# Patient Record
Sex: Female | Born: 1990 | Race: Black or African American | Hispanic: No | Marital: Married | Smoking: Never smoker
Health system: Southern US, Community
[De-identification: ages and names within clinical notes are randomized; demographics above are authoritative.]

## PROBLEM LIST (undated history)

## (undated) ENCOUNTER — Inpatient Hospital Stay (HOSPITAL_COMMUNITY): Payer: Self-pay

## (undated) DIAGNOSIS — Z789 Other specified health status: Secondary | ICD-10-CM

## (undated) HISTORY — PX: NO PAST SURGERIES: SHX2092

---

## 2015-05-24 ENCOUNTER — Emergency Department (HOSPITAL_COMMUNITY)
Admission: EM | Admit: 2015-05-24 | Discharge: 2015-05-24 | Disposition: A | Discharge status: Home / Self Care | Payer: Medicaid Other | Attending: Emergency Medicine | Admitting: Emergency Medicine

## 2015-05-24 ENCOUNTER — Emergency Department (HOSPITAL_COMMUNITY): Payer: Medicaid Other

## 2015-05-24 ENCOUNTER — Encounter (HOSPITAL_COMMUNITY): Payer: Self-pay | Admitting: *Deleted

## 2015-05-24 DIAGNOSIS — O26899 Other specified pregnancy related conditions, unspecified trimester: Secondary | ICD-10-CM

## 2015-05-24 DIAGNOSIS — O9989 Other specified diseases and conditions complicating pregnancy, childbirth and the puerperium: Secondary | ICD-10-CM | POA: Diagnosis present

## 2015-05-24 DIAGNOSIS — Z3A01 Less than 8 weeks gestation of pregnancy: Secondary | ICD-10-CM | POA: Insufficient documentation

## 2015-05-24 DIAGNOSIS — O98811 Other maternal infectious and parasitic diseases complicating pregnancy, first trimester: Secondary | ICD-10-CM | POA: Insufficient documentation

## 2015-05-24 DIAGNOSIS — A5901 Trichomonal vulvovaginitis: Secondary | ICD-10-CM | POA: Insufficient documentation

## 2015-05-24 DIAGNOSIS — R109 Unspecified abdominal pain: Secondary | ICD-10-CM

## 2015-05-24 DIAGNOSIS — Z3490 Encounter for supervision of normal pregnancy, unspecified, unspecified trimester: Secondary | ICD-10-CM

## 2015-05-24 DIAGNOSIS — R1030 Lower abdominal pain, unspecified: Secondary | ICD-10-CM | POA: Diagnosis not present

## 2015-05-24 LAB — BASIC METABOLIC PANEL
Anion gap: 7 (ref 5–15)
BUN: 10 mg/dL (ref 6–20)
CHLORIDE: 108 mmol/L (ref 101–111)
CO2: 23 mmol/L (ref 22–32)
CREATININE: 0.68 mg/dL (ref 0.44–1.00)
Calcium: 9 mg/dL (ref 8.9–10.3)
GFR calc non Af Amer: >60 mL/min (ref >60)
Glucose, Bld: 88 mg/dL (ref 65–99)
Potassium: 3.7 mmol/L (ref 3.5–5.1)
Sodium: 138 mmol/L (ref 135–145)

## 2015-05-24 LAB — ABO/RH: ABO/RH(D): A POS

## 2015-05-24 LAB — CBC WITH DIFFERENTIAL/PLATELET
Basophils Absolute: 0 10*3/uL (ref 0.0–0.1)
Basophils Relative: 0 % (ref 0–1)
EOS ABS: 0.1 10*3/uL (ref 0.0–0.7)
Eosinophils Relative: 2 % (ref 0–5)
HEMATOCRIT: 36.1 % (ref 36.0–46.0)
HEMOGLOBIN: 12.6 g/dL (ref 12.0–15.0)
LYMPHS ABS: 3 10*3/uL (ref 0.7–4.0)
Lymphocytes Relative: 42 % (ref 12–46)
MCH: 32.1 pg (ref 26.0–34.0)
MCHC: 34.9 g/dL (ref 30.0–36.0)
MCV: 92.1 fL (ref 78.0–100.0)
MONOS PCT: 8 % (ref 3–12)
Monocytes Absolute: 0.6 10*3/uL (ref 0.1–1.0)
NEUTROS ABS: 3.5 10*3/uL (ref 1.7–7.7)
NEUTROS PCT: 48 % (ref 43–77)
Platelets: 185 10*3/uL (ref 150–400)
RBC: 3.92 MIL/uL (ref 3.87–5.11)
RDW: 12.4 % (ref 11.5–15.5)
WBC: 7.2 10*3/uL (ref 4.0–10.5)

## 2015-05-24 LAB — URINALYSIS, ROUTINE W REFLEX MICROSCOPIC
Bilirubin Urine: NEGATIVE
Glucose, UA: NEGATIVE mg/dL
HGB URINE DIPSTICK: NEGATIVE
Ketones, ur: NEGATIVE mg/dL
NITRITE: NEGATIVE
PROTEIN: NEGATIVE mg/dL
Specific Gravity, Urine: 1.016 (ref 1.005–1.030)
UROBILINOGEN UA: 1 mg/dL (ref 0.0–1.0)
pH: 7 (ref 5.0–8.0)

## 2015-05-24 LAB — URINE MICROSCOPIC-ADD ON

## 2015-05-24 LAB — WET PREP, GENITAL: Yeast Wet Prep HPF POC: NONE SEEN

## 2015-05-24 LAB — HCG, QUANTITATIVE, PREGNANCY: hCG, Beta Chain, Quant, S: 11122 m[IU]/mL — ABNORMAL HIGH (ref <5)

## 2015-05-24 LAB — POC URINE PREG, ED: PREG TEST UR: POSITIVE — AB

## 2015-05-24 MED ORDER — METRONIDAZOLE 500 MG PO TABS: 500.0000 mg | ORAL_TABLET | Freq: Two times a day (BID) | ORAL | Status: DC

## 2015-05-24 MED ORDER — PRENATAL COMPLETE 14-0.4 MG PO TABS: 1.0000 | ORAL_TABLET | Freq: Every day | ORAL | Status: DC

## 2015-05-24 NOTE — Discharge Instructions (Signed)
Read the information below.  Use the prescribed medication as directed.  Please discuss all new medications with your pharmacist.  You may return to the Emergency Department at any time for worsening condition or any new symptoms that concern you.  If you develop high fevers, worsening abdominal pain, uncontrolled vomiting, or are unable to tolerate fluids by mouth, return to the ER for a recheck.   If you have any concerns or problems involving your pregnancy, please go directly to [facility] MAU  (Maternity Admissions Unit).      Abdominal Pain During Pregnancy Abdominal pain is common in pregnancy. Most of the time, it does not cause harm. There are many causes of abdominal pain. Some causes are more serious than others. Some of the causes of abdominal pain in pregnancy are easily diagnosed. Occasionally, the diagnosis takes time to understand. Other times, the cause is not determined. Abdominal pain can be a sign that something is very wrong with the pregnancy, or the pain may have nothing to do with the pregnancy at all. For this reason, always tell your health care provider if you have any abdominal discomfort. HOME CARE INSTRUCTIONS  Monitor your abdominal pain for any changes. The following actions may help to alleviate any discomfort you are experiencing:  Do not have sexual intercourse or put anything in your vagina until your symptoms go away completely.  Get plenty of rest until your pain improves.  Drink clear fluids if you feel nauseous. Avoid solid food as long as you are uncomfortable or nauseous.  Only take over-the-counter or prescription medicine as directed by your health care provider.  Keep all follow-up appointments with your health care provider. SEEK IMMEDIATE MEDICAL CARE IF:  You are bleeding, leaking fluid, or passing tissue from the vagina.  You have increasing pain or cramping.  You have persistent vomiting.  You have painful or bloody urination.  You  have a fever.  You notice a decrease in your baby's movements.  You have extreme weakness or feel faint.  You have shortness of breath, with or without abdominal pain.  You develop a severe headache with abdominal pain.  You have abnormal vaginal discharge with abdominal pain.  You have persistent diarrhea.  You have abdominal pain that continues even after rest, or gets worse. MAKE SURE YOU:   Understand these instructions.  Will watch your condition.  Will get help right away if you are not doing well or get worse. Document Released: 09/11/2005 Document Revised: 07/02/2013 Document Reviewed: 04/10/2013 [facility] Patient Information 2015 [place], [place]. This information is not intended to replace advice given to you by your health care provider. Make sure you discuss any questions you have with your health care provider.  Trichomoniasis Trichomoniasis is an infection caused by an organism called Trichomonas. The infection can affect both women and men. In women, the outer female genitalia and the vagina are affected. In men, the penis is mainly affected, but the prostate and other reproductive organs can also be involved. Trichomoniasis is a sexually transmitted infection (STI) and is most often passed to another person through sexual contact.  RISK FACTORS  Having unprotected sexual intercourse.  Having sexual intercourse with an infected partner. SIGNS AND SYMPTOMS  Symptoms of trichomoniasis in women include:  Abnormal gray-green frothy vaginal discharge.  Itching and irritation of the vagina.  Itching and irritation of the area outside the vagina. Symptoms of trichomoniasis in men include:   Penile discharge with or without pain.  Pain during  urination. This results from inflammation of the urethra. DIAGNOSIS  Trichomoniasis may be found during a Pap test or physical exam. Your health care provider may use one of the following methods to help diagnose this  infection:  Examining vaginal discharge under a microscope. For men, urethral discharge would be examined.  Testing the pH of the vagina with a test tape.  Using a vaginal swab test that checks for the Trichomonas organism. A test is available that provides results within a few minutes.  Doing a culture test for the organism. This is not usually needed. TREATMENT   You may be given medicine to fight the infection. Women should inform their health care provider if they could be or are pregnant. Some medicines used to treat the infection should not be taken during pregnancy.  Your health care provider may recommend over-the-counter medicines or creams to decrease itching or irritation.  Your sexual partner will need to be treated if infected. HOME CARE INSTRUCTIONS   Take medicines only as directed by your health care provider.  Take over-the-counter medicine for itching or irritation as directed by your health care provider.  Do not have sexual intercourse while you have the infection.  Women should not douche or wear tampons while they have the infection.  Discuss your infection with your partner. Your partner may have gotten the infection from you, or you may have gotten it from your partner.  Have your sex partner get examined and treated if necessary.  Practice safe, informed, and protected sex.  See your health care provider for other STI testing. SEEK MEDICAL CARE IF:   You still have symptoms after you finish your medicine.  You develop abdominal pain.  You have pain when you urinate.  You have bleeding after sexual intercourse.  You develop a rash.  Your medicine makes you sick or makes you throw up (vomit). MAKE SURE YOU:  Understand these instructions.  Will watch your condition.  Will get help right away if you are not doing well or get worse. Document Released: 03/07/2001 Document Revised: 01/26/2014 Document Reviewed: 06/23/2013 [facility] Patient  Information 2015 [place], [place]. This information is not intended to replace advice given to you by your health care provider. Make sure you discuss any questions you have with your health care provider.    First Trimester of Pregnancy The first trimester of pregnancy is from week 1 until the end of week 12 (months 1 through 3). A week after a sperm fertilizes an egg, the egg will implant on the wall of the uterus. This embryo will begin to develop into a baby. Genes from you and your partner are forming the baby. The female genes determine whether the baby is a boy or a girl. At 6-8 weeks, the eyes and face are formed, and the heartbeat can be seen on ultrasound. At the end of 12 weeks, all the baby's organs are formed.  Now that you are pregnant, you will want to do everything you can to have a healthy baby. Two of the most important things are to get good prenatal care and to follow your health care provider's instructions. Prenatal care is all the medical care you receive before the baby's birth. This care will help prevent, find, and treat any problems during the pregnancy and childbirth. BODY CHANGES Your body goes through many changes during pregnancy. The changes vary from woman to woman.   You may gain or lose a couple of pounds at first.  You may  feel sick to your stomach (nauseous) and throw up (vomit). If the vomiting is uncontrollable, call your health care provider.  You may tire easily.  You may develop headaches that can be relieved by medicines approved by your health care provider.  You may urinate more often. Painful urination may mean you have a bladder infection.  You may develop heartburn as a result of your pregnancy.  You may develop constipation because certain hormones are causing the muscles that push waste through your intestines to slow down.  You may develop hemorrhoids or swollen, bulging veins (varicose veins).  Your breasts may begin to grow larger and become  tender. Your nipples may stick out more, and the tissue that surrounds them (areola) may become darker.  Your gums may bleed and may be sensitive to brushing and flossing.  Dark spots or blotches (chloasma, mask of pregnancy) may develop on your face. This will likely fade after the baby is born.  Your menstrual periods will stop.  You may have a loss of appetite.  You may develop cravings for certain kinds of food.  You may have changes in your emotions from day to day, such as being excited to be pregnant or being concerned that something may go wrong with the pregnancy and baby.  You may have more vivid and strange dreams.  You may have changes in your hair. These can include thickening of your hair, rapid growth, and changes in texture. Some women also have hair loss during or after pregnancy, or hair that feels dry or thin. Your hair will most likely return to normal after your baby is born. WHAT TO EXPECT AT YOUR PRENATAL VISITS During a routine prenatal visit:  You will be weighed to make sure you and the baby are growing normally.  Your blood pressure will be taken.  Your abdomen will be measured to track your baby's growth.  The fetal heartbeat will be listened to starting around week 10 or 12 of your pregnancy.  Test results from any previous visits will be discussed. Your health care provider may ask you:  How you are feeling.  If you are feeling the baby move.  If you have had any abnormal symptoms, such as leaking fluid, bleeding, severe headaches, or abdominal cramping.  If you have any questions. Other tests that may be performed during your first trimester include:  Blood tests to find your blood type and to check for the presence of any previous infections. They will also be used to check for low iron levels (anemia) and Rh antibodies. Later in the pregnancy, blood tests for diabetes will be done along with other tests if problems develop.  Urine tests to  check for infections, diabetes, or protein in the urine.  An ultrasound to confirm the proper growth and development of the baby.  An amniocentesis to check for possible genetic problems.  Fetal screens for spina bifida and Down syndrome.  You may need other tests to make sure you and the baby are doing well. HOME CARE INSTRUCTIONS  Medicines  Follow your health care provider's instructions regarding medicine use. Specific medicines may be either safe or unsafe to take during pregnancy.  Take your prenatal vitamins as directed.  If you develop constipation, try taking a stool softener if your health care provider approves. Diet  Eat regular, well-balanced meals. Choose a variety of foods, such as meat or vegetable-based protein, fish, milk and low-fat dairy products, vegetables, fruits, and whole grain breads and cereals.  Your health care provider will help you determine the amount of weight gain that is right for you.  Avoid raw meat and uncooked cheese. These carry germs that can cause birth defects in the baby.  Eating four or five small meals rather than three large meals a day may help relieve nausea and vomiting. If you start to feel nauseous, eating a few soda crackers can be helpful. Drinking liquids between meals instead of during meals also seems to help nausea and vomiting.  If you develop constipation, eat more high-fiber foods, such as fresh vegetables or fruit and whole grains. Drink enough fluids to keep your urine clear or pale yellow. Activity and Exercise  Exercise only as directed by your health care provider. Exercising will help you:  Control your weight.  Stay in shape.  Be prepared for labor and delivery.  Experiencing pain or cramping in the lower abdomen or low back is a good sign that you should stop exercising. Check with your health care provider before continuing normal exercises.  Try to avoid standing for long periods of time. Move your legs often  if you must stand in one place for a long time.  Avoid heavy lifting.  Wear low-heeled shoes, and practice good posture.  You may continue to have sex unless your health care provider directs you otherwise. Relief of Pain or Discomfort  Wear a good support bra for breast tenderness.   Take warm sitz baths to soothe any pain or discomfort caused by hemorrhoids. Use hemorrhoid cream if your health care provider approves.   Rest with your legs elevated if you have leg cramps or low back pain.  If you develop varicose veins in your legs, wear support hose. Elevate your feet for 15 minutes, 3-4 times a day. Limit salt in your diet. Prenatal Care  Schedule your prenatal visits by the twelfth week of pregnancy. They are usually scheduled monthly at first, then more often in the last 2 months before delivery.  Write down your questions. Take them to your prenatal visits.  Keep all your prenatal visits as directed by your health care provider. Safety  Wear your seat belt at all times when driving.  Make a list of emergency phone numbers, including numbers for family, friends, the hospital, and police and fire departments. General Tips  Ask your health care provider for a referral to a local prenatal education class. Begin classes no later than at the beginning of month 6 of your pregnancy.  Ask for help if you have counseling or nutritional needs during pregnancy. Your health care provider can offer advice or refer you to specialists for help with various needs.  Do not use hot tubs, steam rooms, or saunas.  Do not douche or use tampons or scented sanitary pads.  Do not cross your legs for long periods of time.  Avoid cat litter boxes and soil used by cats. These carry germs that can cause birth defects in the baby and possibly loss of the fetus by miscarriage or stillbirth.  Avoid all smoking, herbs, alcohol, and medicines not prescribed by your health care provider. Chemicals in  these affect the formation and growth of the baby.  Schedule a dentist appointment. At home, brush your teeth with a soft toothbrush and be gentle when you floss. SEEK MEDICAL CARE IF:   You have dizziness.  You have mild pelvic cramps, pelvic pressure, or nagging pain in the abdominal area.  You have persistent nausea, vomiting, or diarrhea.  You have a bad smelling vaginal discharge.  You have pain with urination.  You notice increased swelling in your face, hands, legs, or ankles. SEEK IMMEDIATE MEDICAL CARE IF:   You have a fever.  You are leaking fluid from your vagina.  You have spotting or bleeding from your vagina.  You have severe abdominal cramping or pain.  You have rapid weight gain or loss.  You vomit blood or material that looks like coffee grounds.  You are exposed to [place] measles and have never had them.  You are exposed to fifth disease or chickenpox.  You develop a severe headache.  You have shortness of breath.  You have any kind of trauma, such as from a fall or a car accident. Document Released: 09/05/2001 Document Revised: 01/26/2014 Document Reviewed: 07/22/2013 [facility] Patient Information 2015 [place], [place]. This information is not intended to replace advice given to you by your health care provider. Make sure you discuss any questions you have with your health care provider.

## 2015-05-24 NOTE — Care Management (Signed)
ED CM spoke with patient regarding starting prenatal care without insurance. Instructed patient to contact the [facility] Department [address]. [phone]  [phone]. Patient verbalized understanding and states she will follow up tomorrow. No further ED CM needs identified.

## 2015-05-24 NOTE — ED Provider Notes (Signed)
CSN: [id number]     Arrival date & time 05/24/15  1330 History  This chart was scribed for non-physician practitioner [name], PA-C working with Marily Memos, MD by Leone Payor, ED Scribe. This patient was seen in room TR01C/TR01C and the patient's care was started at 4:59 PM.    Chief Complaint  Patient presents with  . Abdominal Pain   The history is provided by the patient. No language interpreter was used.    HPI Comments: Nicole Fuller is a 24 y.o. female who presents to the Emergency Department complaining of 4 days of intermittent, unchanged, bilateral lower abdomen pain which she describes as soreness. She states the most recent episode occurred 2 days ago. She also reports noticing an abdominal knot under the umbilicus, associated increased urination days, and 4 days of constant vaginal discharge which she describes as clear and stringy, profuse.  She denies fever, chills, nausea, vomiting, hematuria, dysuria, urgency, vaginal bleeding, constipation, diarrhea, hematochezia, melena. Her LMP 03/27/15 but states she has irregular periods.    History reviewed. No pertinent past medical history. History reviewed. No pertinent past surgical history. History reviewed. No pertinent family history. Social History  Substance Use Topics  . Smoking status: Never Smoker   . Smokeless tobacco: None  . Alcohol Use: No   OB History    No data available     Review of Systems  Constitutional: Negative for fever and chills.  Gastrointestinal: Positive for abdominal pain. Negative for nausea, vomiting, diarrhea, constipation and blood in stool.  Genitourinary: Positive for frequency and vaginal discharge. Negative for dysuria, urgency, hematuria and vaginal bleeding.  All other systems reviewed and are negative.     Allergies  Review of patient's allergies indicates no known allergies.  Home Medications   Prior to Admission medications   Not on File   BP 130/81 mmHg  Pulse 84   Temp(Src) 98.1 F (36.7 C) (Oral)  Resp 16  SpO2 100% Physical Exam  Constitutional: She appears well-developed and well-nourished. No distress.  HENT:  Head: Normocephalic and atraumatic.  Neck: Neck supple.  Cardiovascular: Normal rate and regular rhythm.   Pulmonary/Chest: Effort normal and breath sounds normal. No respiratory distress. She has no wheezes. She has no rales.  Abdominal: Soft. She exhibits no distension. There is no tenderness. There is no rebound and no guarding.  Genitourinary: Cervix exhibits no motion tenderness. There is tenderness in the vagina. Vaginal discharge found.  Chaperone present: moderate amount of white discharge, vagina diffusely tender. Os is closed. No CMT. Patient unable to fully relax for exam, bimanual therefore limited. No masses noted. No bleeding.   Neurological: She is alert.  Skin: She is not diaphoretic.  Nursing note and vitals reviewed.   ED Course  Procedures (including critical care time)  DIAGNOSTIC STUDIES: Oxygen Saturation is 100% on RA, normal by my interpretation.    COORDINATION OF CARE: 5:13 PM Discussed treatment plan with pt at bedside and pt agreed to plan.   Labs Review Labs Reviewed  URINALYSIS, ROUTINE W REFLEX MICROSCOPIC (NOT AT [facility]) - Abnormal; Notable for the following:    APPearance HAZY (*)    Leukocytes, UA LARGE (*)    All other components within normal limits  URINE MICROSCOPIC-ADD ON - Abnormal; Notable for the following:    Squamous Epithelial / LPF MANY (*)    Bacteria, UA FEW (*)    All other components within normal limits  POC URINE PREG, ED - Abnormal; Notable for the following:  Preg Test, Ur POSITIVE (*)    All other components within normal limits  BASIC METABOLIC PANEL  CBC WITH DIFFERENTIAL/PLATELET  HCG, QUANTITATIVE, PREGNANCY  ABO/RH    Imaging Review No results found. I have personally reviewed and evaluated these images and lab results as part of my medical  decision-making.   EKG Interpretation None      MDM   Final diagnoses:  Abdominal pain in pregnancy  Intrauterine pregnancy  Trichomonas vaginalis (TV) infection   Afebrile, nontoxic patient with lower abdominal pain and increased abnormal vaginal discharge.  Does have increased urination but no dysuria.  Found to be pregnant.  US showed IUP that is early, pt will need to be rechecked to ensure appropriate pregnancy progression.  She is having symptomatic abnormal discharge and UA and wet prep show trich, will treat.  UA appears infected but likely due to trichomonas as pt does have increased urination/frequency that may be related to pregnancy - no dysuria or urgency.  Will send culture.  Would recommend treatment if positive.  Seen by case manager with information for obtaining pregnancy medicaid and follow up.  D/C home with prenatal vitamins, flagyl, information regarding pregnancy, f/u with health department, [facility] hospital for any concerns or worsening symptoms.  Discussed result, findings, treatment, and follow up  with patient.  Pt given return precautions.  Pt verbalizes understanding and agrees with plan.        I personally performed the services described in this documentation, which was scribed in my presence. The recorded information has been reviewed and is accurate.   [name], PA-C 05/24/15 2217  Richardean Canal, MD 05/25/15 [phone]

## 2015-05-24 NOTE — ED Notes (Signed)
Pelvic cart at bedside. 

## 2015-05-24 NOTE — ED Notes (Signed)
Patient reports intermittent lower abdominal pain x 4 days with no associated symptoms, states that she feels like her abdomen is distended.

## 2015-05-25 LAB — GC/CHLAMYDIA PROBE AMP (~~LOC~~) NOT AT ARMC
Chlamydia: NEGATIVE
NEISSERIA GONORRHEA: NEGATIVE

## 2015-05-25 LAB — RPR: RPR Ser Ql: NONREACTIVE

## 2015-05-25 LAB — HIV ANTIBODY (ROUTINE TESTING W REFLEX): HIV SCREEN 4TH GENERATION: NONREACTIVE

## 2015-06-13 ENCOUNTER — Inpatient Hospital Stay (HOSPITAL_COMMUNITY)
Admission: AD | Admit: 2015-06-13 | Discharge: 2015-06-13 | Disposition: A | Discharge status: Home / Self Care | Payer: Medicaid Other | Source: Ambulatory Visit | Attending: Family Medicine | Admitting: Family Medicine

## 2015-06-13 ENCOUNTER — Encounter (HOSPITAL_COMMUNITY): Payer: Self-pay

## 2015-06-13 DIAGNOSIS — O26891 Other specified pregnancy related conditions, first trimester: Secondary | ICD-10-CM | POA: Insufficient documentation

## 2015-06-13 DIAGNOSIS — O26899 Other specified pregnancy related conditions, unspecified trimester: Secondary | ICD-10-CM

## 2015-06-13 DIAGNOSIS — Z3A01 Less than 8 weeks gestation of pregnancy: Secondary | ICD-10-CM | POA: Diagnosis not present

## 2015-06-13 DIAGNOSIS — O9989 Other specified diseases and conditions complicating pregnancy, childbirth and the puerperium: Secondary | ICD-10-CM

## 2015-06-13 DIAGNOSIS — R109 Unspecified abdominal pain: Secondary | ICD-10-CM | POA: Diagnosis present

## 2015-06-13 DIAGNOSIS — Z202 Contact with and (suspected) exposure to infections with a predominantly sexual mode of transmission: Secondary | ICD-10-CM | POA: Insufficient documentation

## 2015-06-13 HISTORY — DX: Other specified health status: Z78.9

## 2015-06-13 LAB — WET PREP, GENITAL
TRICH WET PREP: NONE SEEN
YEAST WET PREP: NONE SEEN

## 2015-06-13 LAB — URINALYSIS, ROUTINE W REFLEX MICROSCOPIC
Bilirubin Urine: NEGATIVE
GLUCOSE, UA: NEGATIVE mg/dL
Hgb urine dipstick: NEGATIVE
KETONES UR: NEGATIVE mg/dL
LEUKOCYTES UA: NEGATIVE
Nitrite: NEGATIVE
PROTEIN: NEGATIVE mg/dL
Specific Gravity, Urine: 1.025 (ref 1.005–1.030)
UROBILINOGEN UA: 0.2 mg/dL (ref 0.0–1.0)
pH: 5.5 (ref 5.0–8.0)

## 2015-06-13 MED ORDER — PROMETHAZINE HCL 25 MG PO TABS: 25.0000 mg | ORAL_TABLET | Freq: Four times a day (QID) | ORAL | Status: DC | PRN

## 2015-06-13 MED ORDER — METRONIDAZOLE 500 MG PO TABS: 500.0000 mg | ORAL_TABLET | Freq: Two times a day (BID) | ORAL | Status: DC

## 2015-06-13 NOTE — MAU Note (Signed)
No vomiting just thirsty all the time, abdominal pain, no vaginal bleeding or discharge, no pain with urination, no constipation. LMP 04/22/15

## 2015-06-13 NOTE — MAU Provider Note (Signed)
History     CSN: [id number]  Arrival date and time: 06/13/15 4098   First Provider Initiated Contact with Patient 06/13/15 1908      Chief Complaint  Patient presents with  . Abdominal Pain  . Polydipsia   HPI 24 y.o. G1P0 at [redacted]w[redacted]d w/ LLQ pain. No vaginal bleeding. Patient also reports nausea w/ no vomiting, denies need for nausea medication, able to keep down food and drink. Pt had u/s at 5.[redacted] weeks EGA showing IUGS and yolk sac. Pt was treated for Trich on 8/29 w/ Flagyl bid x 7 days. She states her partner went for testing, but did not tell the provider that she had trich and he was not treated, they have had unprotected intercourse since she was treated.   Past Medical History  Diagnosis Date  . Medical history non-contributory     Past Surgical History  Procedure Laterality Date  . No past surgeries      History reviewed. No pertinent family history.  Social History  Substance Use Topics  . Smoking status: Never Smoker   . Smokeless tobacco: None  . Alcohol Use: No    Allergies: No Known Allergies  Prescriptions prior to admission  Medication Sig Dispense Refill Last Dose  . Prenatal Vit-Fe Fumarate-FA (PRENATAL COMPLETE) 14-0.4 MG TABS Take 1 tablet by mouth daily. 60 each 5 06/13/2015 at Unknown time  . metroNIDAZOLE (FLAGYL) 500 MG tablet Take 1 tablet (500 mg total) by mouth 2 (two) times daily. (Patient not taking: Reported on 06/13/2015) 14 tablet 0 Completed Course    Review of Systems  Constitutional: Negative.   HENT: Negative.   Eyes: Negative.   Respiratory: Negative.   Cardiovascular: Negative.   Gastrointestinal: Positive for nausea and abdominal pain (LLQ). Negative for vomiting, diarrhea and constipation.  Genitourinary: Negative for dysuria, urgency, frequency, hematuria and flank pain.  Skin: Negative.   Neurological: Negative.    Physical Exam   Blood pressure 128/83, pulse 69, temperature 98.2 F (36.8 C), resp. rate 16, height   (1.753 m), weight 124 lb 9.6 oz (56.518 kg), last menstrual period 04/22/2015.  Physical Exam  Nursing note and vitals reviewed. Constitutional: She is oriented to person, place, and time. She appears well-developed and well-nourished. No distress.  Cardiovascular: Normal rate.   Respiratory: Effort normal.  GI: Soft. She exhibits no distension and no mass. There is tenderness (mild LLQ). There is no rebound and no guarding.  Musculoskeletal: Normal range of motion.  Neurological: She is alert and oriented to person, place, and time.  Skin: Skin is warm and dry.  Psychiatric: She has a normal mood and affect.    MAU Course  Procedures  Results for orders placed or performed during the hospital encounter of 06/13/15 (from the past 24 hour(s))  Urinalysis, Routine w reflex microscopic (not at [facility])     Status: None   Collection Time: 06/13/15  6:50 PM  Result Value Ref Range   Color, Urine YELLOW YELLOW   APPearance CLEAR CLEAR   Specific Gravity, Urine 1.025 1.005 - 1.030   pH 5.5 5.0 - 8.0   Glucose, UA NEGATIVE NEGATIVE mg/dL   Hgb urine dipstick NEGATIVE NEGATIVE   Bilirubin Urine NEGATIVE NEGATIVE   Ketones, ur NEGATIVE NEGATIVE mg/dL   Protein, ur NEGATIVE NEGATIVE mg/dL   Urobilinogen, UA 0.2 0.0 - 1.0 mg/dL   Nitrite NEGATIVE NEGATIVE   Leukocytes, UA NEGATIVE NEGATIVE     Assessment and Plan   1. Abdominal pain in  pregnancy, antepartum   2. Exposure to sexually transmitted disease (STD)   Treating w/ flagyl today for BV and possible reinfection with trichomoniasis, expedited partner therapy provided as well, partner present, rev'd Flagyl instructions, alcohol precautions, no contraindications or allergies, rx for [facility], Flagyl 500 mg, 4 tabs po x 1, disp #4, no refills. Partner treatment info sheet provided.     Medication List    TAKE these medications        metroNIDAZOLE 500 MG tablet  Commonly known as:  FLAGYL  Take 1 tablet (500 mg total) by mouth  2 (two) times daily.     PRENATAL COMPLETE 14-0.4 MG Tabs  Take 1 tablet by mouth daily.     promethazine 25 MG tablet  Commonly known as:  PHENERGAN  Take 1 tablet (25 mg total) by mouth every 6 (six) hours as needed for nausea or vomiting.            Follow-up Information    Follow up with [facility] [place].   Why:  as scheduled   Contact information:   [address] [place] [address] [phone]        FRAZIER,NATALIE 06/13/2015, 7:21 PM

## 2015-06-13 NOTE — Discharge Instructions (Signed)
Expedited Partner Therapy:  °Information Sheet for Patients and Partners  °            ° °You have been offered expedited partner therapy (EPT). This information sheet contains important information and warnings you need to be aware of, so please read it carefully.  ° °Expedited Partner Therapy (EPT) is the clinical practice of treating the sexual partners of persons who receive chlamydia, gonorrhea, or trichomoniasis diagnoses by providing medications or prescriptions to the patient. Patients then provide partners with these therapies without the health-care provider having examined the partner. In other words, EPT is a convenient, fast and private way for patients to help their sexual partners get treated.  ° °Chlamydia and gonorrhea are bacterial infections you get from having sex with a person who is already infected. Trichomoniasis (or “trich”) is a very common sexually transmitted infection (STI) that is caused by infection with a protozoan parasite called Trichomonas vaginalis.  Many people with these infections don’t know it because they feel fine, but without treatment these infections can cause serious health problems, such as pelvic inflammatory disease, ectopic pregnancy, infertility and increased risk of HIV.  ° °It is important to get treated as soon as possible to protect your health, to avoid spreading these infections to others, and to prevent yourself from becoming re-infected. The good news is these infections can be easily cured with proper antibiotic medicine. The best way to take care of your self is to see a doctor or go to your local health department. If you are not able to see a doctor or other medical provider, you should take EPT.  ° ° °Recommended Medication: °EPT for Chlamydia:  Azithromycin (Zithromax) 1 gram orally in a single dose °EPT for Gonorrhea:  Cefixime (Suprax) 400 milligrams orally in a single dose PLUS azithromycin (Zithromax) 1 gram orally in a single dose °EPT for  Trichomoniasis:  Metronidazole (Flagyl) 2 grams orally in a single dose ° ° °These medicines are very safe. However, you should not take them if you have ever had an allergic reaction (like a rash) to any of these medicines: azithromycin (Zithromax), erythromycin, clarithromycin (Biaxin), metronidazole (Flagyl), tinidazole (Tindimax). If you are uncertain about whether you have an allergy, call your medical provider or pharmacist before taking this medicine. If you have a serious, long-term illness like kidney, liver or heart disease, colitis or stomach problems, or you are currently taking other prescription medication, talk to your provider before taking this medication.  ° °Women: If you have lower belly pain, pain during sex, vomiting, or a fever, do not take this medicine. Instead, you should see a medical provider to be certain you do not have pelvic inflammatory disease (PID). PID can be serious and lead to infertility, pregnancy problems or chronic pelvic pain.  ° °Pregnant Women: It is very important for you to see a doctor to get pregnancy services and pre-natal care. These antibiotics for EPT are safe for pregnant women, but you still need to see a medical provider as soon as possible. It is also important to note that Doxycycline is an alternative therapy for chlamydia, but it should not be taken by someone who is pregnant.  ° °Men: If you have pain or swelling in the testicles or a fever, do not take this medicine and see a medical provider.    ° °Men who have sex with men (MSM): MSM in [place] continue to experience high rates of syphilis and HIV. Many MSM with gonorrhea or   chlamydia could also have syphilis and/or HIV and not know it. If you are a man who has sex with other men, it is very important that you see a medical provider and are tested for HIV and syphilis. EPT is not recommended for gonorrhea for MSM.  Recommended treatment for gonorrhea for MSM is Rocephin (shot) AND azithromycin  due to decreased cure rate.  Please see your medical provider if this is the case.   ° °Along with this information sheet is a prescription for the medicine. If you receive a prescription it will be in your name and will indicate your date of birth, or it will be in the name of “Expedited Partner Therapy”.   In either case, you can have the prescription filled at a pharmacy. You will be responsible for the cost of the medicine, unless you have prescription drug coverage. In that case, you could provide your name so the pharmacy could bill your health plan.  ° °Take the medication as directed. Some people will have a mild, upset stomach, which does not last long. AVOID alcohol 24 hours after taking metronidazole (Flagyl) to reduce the possibility of a disulfiram-like reaction (severe vomiting and abdominal pain).  After taking the medicine, do not have sex for 7 days. Do not share this medicine or give it to anyone else. It is important to tell everyone you have had sex with in the last 60 days that they need to go and get tested for sexually transmitted infections.  ° °Ways to prevent these and other sexually transmitted infections (STIs):  ° °• Abstain from sex. This is the only sure way to avoid getting an STI.  °• Use barrier methods, such as condoms, consistently and correctly.  °• Limit the number of sexual partners.  °• Have regular physical exams, including testing for STIs.  ° °For more information about EPT or other issues pertaining to an STI, please contact your medical provider or the [place] [facility] at [phone] or [email or web address]   ° °

## 2015-06-23 ENCOUNTER — Encounter (HOSPITAL_COMMUNITY): Payer: Self-pay | Admitting: *Deleted

## 2015-06-23 ENCOUNTER — Emergency Department (HOSPITAL_COMMUNITY): Payer: Medicaid Other

## 2015-06-23 ENCOUNTER — Emergency Department (HOSPITAL_COMMUNITY)
Admission: EM | Admit: 2015-06-23 | Discharge: 2015-06-23 | Disposition: A | Discharge status: Home / Self Care | Payer: Medicaid Other | Attending: Emergency Medicine | Admitting: Emergency Medicine

## 2015-06-23 DIAGNOSIS — R1084 Generalized abdominal pain: Secondary | ICD-10-CM | POA: Diagnosis not present

## 2015-06-23 DIAGNOSIS — Z3490 Encounter for supervision of normal pregnancy, unspecified, unspecified trimester: Secondary | ICD-10-CM

## 2015-06-23 DIAGNOSIS — R55 Syncope and collapse: Secondary | ICD-10-CM | POA: Insufficient documentation

## 2015-06-23 DIAGNOSIS — R42 Dizziness and giddiness: Secondary | ICD-10-CM | POA: Insufficient documentation

## 2015-06-23 DIAGNOSIS — Z3A09 9 weeks gestation of pregnancy: Secondary | ICD-10-CM | POA: Diagnosis not present

## 2015-06-23 DIAGNOSIS — R102 Pelvic and perineal pain: Secondary | ICD-10-CM

## 2015-06-23 DIAGNOSIS — O9989 Other specified diseases and conditions complicating pregnancy, childbirth and the puerperium: Secondary | ICD-10-CM | POA: Diagnosis present

## 2015-06-23 LAB — CBC
HCT: 37.3 % (ref 36.0–46.0)
Hemoglobin: 13 g/dL (ref 12.0–15.0)
MCH: 31.3 pg (ref 26.0–34.0)
MCHC: 34.9 g/dL (ref 30.0–36.0)
MCV: 89.9 fL (ref 78.0–100.0)
PLATELETS: 184 10*3/uL (ref 150–400)
RBC: 4.15 MIL/uL (ref 3.87–5.11)
RDW: 12.2 % (ref 11.5–15.5)
WBC: 6.1 10*3/uL (ref 4.0–10.5)

## 2015-06-23 LAB — URINALYSIS, ROUTINE W REFLEX MICROSCOPIC
BILIRUBIN URINE: NEGATIVE
GLUCOSE, UA: NEGATIVE mg/dL
HGB URINE DIPSTICK: NEGATIVE
KETONES UR: 15 mg/dL — AB
Nitrite: NEGATIVE
PROTEIN: NEGATIVE mg/dL
Specific Gravity, Urine: 1.029 (ref 1.005–1.030)
Urobilinogen, UA: 1 mg/dL (ref 0.0–1.0)
pH: 6 (ref 5.0–8.0)

## 2015-06-23 LAB — COMPREHENSIVE METABOLIC PANEL
ALBUMIN: 4 g/dL (ref 3.5–5.0)
ALK PHOS: 41 U/L (ref 38–126)
ALT: 67 U/L — AB (ref 14–54)
AST: 51 U/L — ABNORMAL HIGH (ref 15–41)
Anion gap: 8 (ref 5–15)
BILIRUBIN TOTAL: 0.4 mg/dL (ref 0.3–1.2)
BUN: 10 mg/dL (ref 6–20)
CALCIUM: 9.2 mg/dL (ref 8.9–10.3)
CO2: 23 mmol/L (ref 22–32)
CREATININE: 0.65 mg/dL (ref 0.44–1.00)
Chloride: 102 mmol/L (ref 101–111)
GFR calc Af Amer: >60 mL/min (ref >60)
GFR calc non Af Amer: >60 mL/min (ref >60)
GLUCOSE: 83 mg/dL (ref 65–99)
Potassium: 3.8 mmol/L (ref 3.5–5.1)
SODIUM: 133 mmol/L — AB (ref 135–145)
TOTAL PROTEIN: 6.9 g/dL (ref 6.5–8.1)

## 2015-06-23 LAB — LIPASE, BLOOD: Lipase: 19 U/L — ABNORMAL LOW (ref 22–51)

## 2015-06-23 LAB — URINE MICROSCOPIC-ADD ON

## 2015-06-23 LAB — HCG, QUANTITATIVE, PREGNANCY: hCG, Beta Chain, Quant, S: 244247 m[IU]/mL — ABNORMAL HIGH (ref <5)

## 2015-06-23 MED ORDER — SODIUM CHLORIDE 0.9 % IV BOLUS (SEPSIS)
1000.0000 mL | Freq: Once | INTRAVENOUS | Status: AC
  Administered 2015-06-23: 1000 mL via INTRAVENOUS

## 2015-06-23 NOTE — ED Notes (Signed)
Spoke with Dr. Juleen China regarding pt.

## 2015-06-23 NOTE — Discharge Instructions (Signed)
Return without fail for worsening symptoms, including severe abdominal pain, vomiting unable to keep down food or fluids, vaginal bleeding, focal deep breathing or chest pain, or any other symptoms concerning to you.  First Trimester of Pregnancy The first trimester of pregnancy is from week 1 until the end of week 12 (months 1 through 3). During this time, your baby will begin to develop inside you. At 6-8 weeks, the eyes and face are formed, and the heartbeat can be seen on ultrasound. At the end of 12 weeks, all the baby's organs are formed. Prenatal care is all the medical care you receive before the birth of your baby. Make sure you get good prenatal care and follow all of your doctor's instructions. HOME CARE  Medicines  Take medicine only as told by your doctor. Some medicines are safe and some are not during pregnancy.  Take your prenatal vitamins as told by your doctor.  Take medicine that helps you poop (stool softener) as needed if your doctor says it is okay. Diet  Eat regular, healthy meals.  Your doctor will tell you the amount of weight gain that is right for you.  Avoid raw meat and uncooked cheese.  If you feel sick to your stomach (nauseous) or throw up (vomit):  Eat 4 or 5 small meals a day instead of 3 large meals.  Try eating a few soda crackers.  Drink liquids between meals instead of during meals.  If you have a hard time pooping (constipation):  Eat high-fiber foods like fresh vegetables, fruit, and whole grains.  Drink enough fluids to keep your pee (urine) clear or pale yellow. Activity and Exercise  Exercise only as told by your doctor. Stop exercising if you have cramps or pain in your lower belly (abdomen) or low back.  Try to avoid standing for long periods of time. Move your legs often if you must stand in one place for a long time.  Avoid heavy lifting.  Wear low-heeled shoes. Sit and stand up straight.  You can have sex unless your doctor  tells you not to. Relief of Pain or Discomfort  Wear a good support bra if your breasts are sore.  Take warm water baths (sitz baths) to soothe pain or discomfort caused by hemorrhoids. Use hemorrhoid cream if your doctor says it is okay.  Rest with your legs raised if you have leg cramps or low back pain.  Wear support hose if you have puffy, bulging veins (varicose veins) in your legs. Raise (elevate) your feet for 15 minutes, 3-4 times a day. Limit salt in your diet. Prenatal Care  Schedule your prenatal visits by the twelfth week of pregnancy.  Write down your questions. Take them to your prenatal visits.  Keep all your prenatal visits as told by your doctor. Safety  Wear your seat belt at all times when driving.  Make a list of emergency phone numbers. The list should include numbers for family, friends, the hospital, and police and fire departments. General Tips  Ask your doctor for a referral to a local prenatal class. Begin classes no later than at the start of month 6 of your pregnancy.  Ask for help if you need counseling or help with nutrition. Your doctor can give you advice or tell you where to go for help.  Do not use hot tubs, steam rooms, or saunas.  Do not douche or use tampons or scented sanitary pads.  Do not cross your legs for long periods  of time.  Avoid litter boxes and soil used by cats.  Avoid all smoking, herbs, and alcohol. Avoid drugs not approved by your doctor.  Visit your dentist. At home, brush your teeth with a soft toothbrush. Be gentle when you floss. GET HELP IF:  You are dizzy.  You have mild cramps or pressure in your lower belly.  You have a nagging pain in your belly area.  You continue to feel sick to your stomach, throw up, or have watery poop (diarrhea).  You have a bad smelling fluid coming from your vagina.  You have pain with peeing (urination).  You have increased puffiness (swelling) in your face, hands, legs, or  ankles. GET HELP RIGHT AWAY IF:   You have a fever.  You are leaking fluid from your vagina.  You have spotting or bleeding from your vagina.  You have very bad belly cramping or pain.  You gain or lose weight rapidly.  You throw up blood. It may look like coffee grounds.  You are around people who have [place] measles, fifth disease, or chickenpox.  You have a very bad headache.  You have shortness of breath.  You have any kind of trauma, such as from a fall or a car accident. Document Released: 02/28/2008 Document Revised: 01/26/2014 Document Reviewed: 07/22/2013 [facility] Patient Information 2015 [place], [place]. This information is not intended to replace advice given to you by your health care provider. Make sure you discuss any questions you have with your health care provider.

## 2015-06-23 NOTE — ED Notes (Signed)
Pt states that she began having dizziness today at 3pm. States that she feels like the room is spinning. Pt reports being [redacted] weeks pregnant. No other neuro deficits noted in triage.

## 2015-06-23 NOTE — ED Provider Notes (Signed)
CSN: [id number]     Arrival date & time 06/23/15  1616 History   First MD Initiated Contact with Patient 06/23/15 1937     Chief Complaint  Patient presents with  . Dizziness  . Abdominal Pain     (Consider location/radiation/quality/duration/timing/severity/associated sxs/prior Treatment) HPI 24 year old female G1 P0 at [redacted] weeks gestational age who presents with dizziness and abdominal pain. Reports being in her usual state of health. Reports that she ate too much last night and developed generalized abdominal pain. That is since results, but today was out side for an extended period time without eating or drinking much. Reports that she suddenly began to feel very lightheaded and dizzy. He did not have syncope, chest pain, difficulty breathing. I didn't drink water but did not feel her symptoms improved so came to the ED for evaluation. Denies nausea vomiting diarrhea vaginal bleeding or vaginal discharge. States that she has minimal abdominal pain currently. Past Medical History  Diagnosis Date  . Medical history non-contributory    Past Surgical History  Procedure Laterality Date  . No past surgeries     History reviewed. No pertinent family history. Social History  Substance Use Topics  . Smoking status: Never Smoker   . Smokeless tobacco: None  . Alcohol Use: No   OB History    Gravida Para Term Preterm AB TAB SAB Ectopic Multiple Living   1 0        0     Review of Systems 10/14 systems reviewed and are negative other than those stated in the HPI    Allergies  Promethazine  Home Medications   Prior to Admission medications   Medication Sig Start Date End Date Taking? Authorizing Provider  Prenatal Vit-Fe Fumarate-FA (PRENATAL COMPLETE) 14-0.4 MG TABS Take 1 tablet by mouth daily. 05/24/15  Yes [name], PA-C  promethazine (PHENERGAN) 25 MG tablet Take 1 tablet (25 mg total) by mouth every 6 (six) hours as needed for nausea or vomiting. 06/13/15  Yes Archie Patten, CNM  metroNIDAZOLE (FLAGYL) 500 MG tablet Take 1 tablet (500 mg total) by mouth 2 (two) times daily. Patient not taking: Reported on 06/23/2015 06/13/15   Archie Patten, CNM   BP 108/57 mmHg  Pulse 78  Temp(Src) 98.8 F (37.1 C) (Oral)  Resp 21  Ht  (1.753 m)  Wt 124 lb (56.246 kg)  BMI 18.30 kg/m2  SpO2 100%  LMP 04/22/2015 (Approximate) Physical Exam Physical Exam  Nursing note and vitals reviewed. Constitutional: Well developed, well nourished, non-toxic, and in no acute distress Head: Normocephalic and atraumatic.  Mouth/Throat: Oropharynx is clear and moist.  Neck: Normal range of motion. Neck supple.  Cardiovascular: Normal rate and regular rhythm.  No edema Pulmonary/Chest: Effort normal and breath sounds normal.  Abdominal: Soft. There is no tenderness. There is no rebound and no guarding.  Musculoskeletal: Normal range of motion.  Neurological: Alert, no facial droop, fluent speech, moves all extremities symmetrically Skin: Skin is warm and dry.  Psychiatric: Cooperative  ED Course  Procedures (including critical care time) Labs Review Labs Reviewed  LIPASE, BLOOD - Abnormal; Notable for the following:    Lipase 19 (*)    All other components within normal limits  COMPREHENSIVE METABOLIC PANEL - Abnormal; Notable for the following:    Sodium 133 (*)    AST 51 (*)    ALT 67 (*)    All other components within normal limits  URINALYSIS, ROUTINE W REFLEX MICROSCOPIC (NOT AT  [facility]) - Abnormal; Notable for the following:    Ketones, ur 15 (*)    Leukocytes, UA SMALL (*)    All other components within normal limits  URINE MICROSCOPIC-ADD ON - Abnormal; Notable for the following:    Squamous Epithelial / LPF FEW (*)    Bacteria, UA FEW (*)    All other components within normal limits  HCG, QUANTITATIVE, PREGNANCY - Abnormal; Notable for the following:    hCG, Beta Chain, Sharene Butters, [place] [id number] (*)    All other components within normal limits  URINE CULTURE   CBC    Imaging Review US Ob Comp Less 14 Wks  06/23/2015   CLINICAL DATA:  Pregnant patient with abdominal pain and dizziness.  EXAM: OBSTETRIC <14 WK [place] AND TRANSVAGINAL OB US  TECHNIQUE: Both transabdominal and transvaginal ultrasound examinations were performed for complete evaluation of the gestation as well as the maternal uterus, adnexal regions, and pelvic cul-de-sac. Transvaginal technique was performed to assess early pregnancy.  COMPARISON:  05/24/2015  FINDINGS: Intrauterine gestational sac: Visualized/normal in shape.  Yolk sac:  Present.  Embryo:  Present.  Cardiac Activity: Present.  Heart Rate: 182  bpm  CRL:  27.4  mm   9 w   4 d                  [place] EDC: 01/22/2016  Maternal uterus/adnexae: No subchorionic hemorrhage. There is a complex cyst in the left ovary measuring 3.3 x 2.1 x 2.6 cm with septation versus 2 adjacent cysts. Blood flow is noted to the ovarian parenchyma. The right ovary is normal. Small amount of free fluid in the pelvis.  IMPRESSION: 1. Single live intrauterine pregnancy estimated gestational age [redacted] weeks 4 days for estimated date of delivery 01/22/2016. There is mild fetal tachycardia with fetal heart rate of 182 beats per minute. Close obstetric follow-up recommended. 2. Septated cyst in the left ovary versus 2 adjacent cysts, measuring 3.3 cm.   Electronically Signed   By: Rubye Oaks M.D.   On: 06/23/2015 22:06   US Ob Transvaginal  06/23/2015   CLINICAL DATA:  Pregnant patient with abdominal pain and dizziness.  EXAM: OBSTETRIC <14 WK [place] AND TRANSVAGINAL OB US  TECHNIQUE: Both transabdominal and transvaginal ultrasound examinations were performed for complete evaluation of the gestation as well as the maternal uterus, adnexal regions, and pelvic cul-de-sac. Transvaginal technique was performed to assess early pregnancy.  COMPARISON:  05/24/2015  FINDINGS: Intrauterine gestational sac: Visualized/normal in shape.  Yolk sac:  Present.  Embryo:  Present.  Cardiac  Activity: Present.  Heart Rate: 182  bpm  CRL:  27.4  mm   9 w   4 d                  [place] EDC: 01/22/2016  Maternal uterus/adnexae: No subchorionic hemorrhage. There is a complex cyst in the left ovary measuring 3.3 x 2.1 x 2.6 cm with septation versus 2 adjacent cysts. Blood flow is noted to the ovarian parenchyma. The right ovary is normal. Small amount of free fluid in the pelvis.  IMPRESSION: 1. Single live intrauterine pregnancy estimated gestational age [redacted] weeks 4 days for estimated date of delivery 01/22/2016. There is mild fetal tachycardia with fetal heart rate of 182 beats per minute. Close obstetric follow-up recommended. 2. Septated cyst in the left ovary versus 2 adjacent cysts, measuring 3.3 cm.   Electronically Signed   By: Rubye Oaks M.D.   On: 06/23/2015 22:06  I have personally reviewed and evaluated these images and lab results as part of my medical decision-making.   EKG Interpretation   Date/Time:  Wednesday June 23 2015 16:51:45 EDT Ventricular Rate:  76 PR Interval:  130 QRS Duration: 76 QT Interval:  360 QTC Calculation: 405 R Axis:   20 Text Interpretation:  Sinus rhythm with marked sinus arrhythmia Otherwise  normal ECG no prior for comparison Confirmed by LIU MD, DANA [phone]) on  06/23/2015 7:57:14 PM      MDM   Final diagnoses:  Near syncope  Intrauterine pregnancy    24 year old female G1 P0 at [redacted] weeks gestational age who presents with near syncope and abdominal pain. She is asymptomatic on arrival, and is well-appearing. Vital signs are within normal limits. She has a soft and nontender abdomen. Low suspicion for serious intra-abdominal processes, but given her abdominal pain and pregnancy I did want to rule out ectopic. Ultrasound revealing IUP at 9 weeks 4 days. No vaginal bleeding. Blood work including CBC, comprehensive metabolic profile, lipase, is unremarkable. UA does show some bacteria, but significant amount of squamous epithelial cells.  We'll send for urine culture and treat if bacteria cultured. Near syncopal episode seems orthostatic in nature as she was out in the heat and had not eaten or drinking much today. EKG without stigmata of arrhythmia. No symptoms with exertional activities. She is given IV fluids and feels symptomatically improved. She has follow-up in 2 days with OB. Strict return and follow-up instructions are reviewed. She expressed understanding of all discharge instructions for comfortable to plan of care.    Lavera Guise, MD 06/24/15 [phone]

## 2015-06-25 LAB — URINE CULTURE

## 2015-07-07 ENCOUNTER — Emergency Department (HOSPITAL_COMMUNITY)
Admission: EM | Admit: 2015-07-07 | Discharge: 2015-07-07 | Disposition: A | Discharge status: Home / Self Care | Payer: Medicaid Other | Attending: Emergency Medicine | Admitting: Emergency Medicine

## 2015-07-07 ENCOUNTER — Encounter (HOSPITAL_COMMUNITY): Payer: Self-pay | Admitting: Emergency Medicine

## 2015-07-07 ENCOUNTER — Emergency Department (HOSPITAL_COMMUNITY): Payer: Medicaid Other

## 2015-07-07 DIAGNOSIS — N760 Acute vaginitis: Secondary | ICD-10-CM | POA: Insufficient documentation

## 2015-07-07 DIAGNOSIS — O209 Hemorrhage in early pregnancy, unspecified: Secondary | ICD-10-CM | POA: Diagnosis not present

## 2015-07-07 DIAGNOSIS — B9689 Other specified bacterial agents as the cause of diseases classified elsewhere: Secondary | ICD-10-CM

## 2015-07-07 DIAGNOSIS — B373 Candidiasis of vulva and vagina: Secondary | ICD-10-CM | POA: Insufficient documentation

## 2015-07-07 DIAGNOSIS — O98811 Other maternal infectious and parasitic diseases complicating pregnancy, first trimester: Secondary | ICD-10-CM | POA: Insufficient documentation

## 2015-07-07 DIAGNOSIS — O9989 Other specified diseases and conditions complicating pregnancy, childbirth and the puerperium: Secondary | ICD-10-CM | POA: Insufficient documentation

## 2015-07-07 DIAGNOSIS — R102 Pelvic and perineal pain unspecified side: Secondary | ICD-10-CM

## 2015-07-07 DIAGNOSIS — B3731 Acute candidiasis of vulva and vagina: Secondary | ICD-10-CM

## 2015-07-07 DIAGNOSIS — O4690 Antepartum hemorrhage, unspecified, unspecified trimester: Secondary | ICD-10-CM

## 2015-07-07 LAB — WET PREP, GENITAL: TRICH WET PREP: NONE SEEN

## 2015-07-07 LAB — URINALYSIS, ROUTINE W REFLEX MICROSCOPIC
Bilirubin Urine: NEGATIVE
Glucose, UA: NEGATIVE mg/dL
HGB URINE DIPSTICK: NEGATIVE
Ketones, ur: NEGATIVE mg/dL
Nitrite: NEGATIVE
PROTEIN: NEGATIVE mg/dL
SPECIFIC GRAVITY, URINE: 1.009 (ref 1.005–1.030)
UROBILINOGEN UA: 1 mg/dL (ref 0.0–1.0)
pH: 7 (ref 5.0–8.0)

## 2015-07-07 LAB — URINE MICROSCOPIC-ADD ON

## 2015-07-07 LAB — ABO/RH: ABO/RH(D): A POS

## 2015-07-07 LAB — GC/CHLAMYDIA PROBE AMP (~~LOC~~) NOT AT ARMC
CHLAMYDIA, DNA PROBE: NEGATIVE
Neisseria Gonorrhea: NEGATIVE

## 2015-07-07 LAB — HCG, QUANTITATIVE, PREGNANCY: hCG, Beta Chain, Quant, S: 158997 m[IU]/mL — ABNORMAL HIGH (ref <5)

## 2015-07-07 MED ORDER — FLUCONAZOLE 100 MG PO TABS
150.0000 mg | ORAL_TABLET | Freq: Once | ORAL | Status: AC
  Administered 2015-07-07: 150 mg via ORAL
  Filled 2015-07-07: qty 2

## 2015-07-07 MED ORDER — METRONIDAZOLE 500 MG PO TABS
500.0000 mg | ORAL_TABLET | Freq: Once | ORAL | Status: AC
  Administered 2015-07-07: 500 mg via ORAL
  Filled 2015-07-07: qty 1

## 2015-07-07 MED ORDER — METRONIDAZOLE 500 MG PO TABS: 500.0000 mg | ORAL_TABLET | Freq: Two times a day (BID) | ORAL | Status: DC

## 2015-07-07 NOTE — ED Provider Notes (Signed)
Received patient in care from Dr. Elesa MassedWard.  Patient with vaginal bleeding and abdominal pain, awaiting US.  US with unremarkable 12 week singleton pregnancy.  D/c home.  Patient with BV on wet prep treat with flagyl.     Nicole Planan Sadye Kiernan, DO 07/07/15 1024

## 2015-07-07 NOTE — Discharge Instructions (Signed)
Vaginal Bleeding During Pregnancy, First Trimester A small amount of bleeding (spotting) from the vagina is relatively common in early pregnancy. It usually stops on its own. Various things may cause bleeding or spotting in early pregnancy. Some bleeding may be related to the pregnancy, and some may not. In most cases, the bleeding is normal and is not a problem. However, bleeding can also be a sign of something serious. Be sure to tell your health care provider about any vaginal bleeding right away. Some possible causes of vaginal bleeding during the first trimester include:  Infection or inflammation of the cervix.  Growths (polyps) on the cervix.  Miscarriage or threatened miscarriage.  Pregnancy tissue has developed outside of the uterus and in a fallopian tube (tubal pregnancy).  Tiny cysts have developed in the uterus instead of pregnancy tissue (molar pregnancy). HOME CARE INSTRUCTIONS  Watch your condition for any changes. The following actions may help to lessen any discomfort you are feeling:  Follow your health care provider's instructions for limiting your activity. If your health care provider orders bed rest, you may need to stay in bed and only get up to use the bathroom. However, your health care provider may allow you to continue light activity.  If needed, make plans for someone to help with your regular activities and responsibilities while you are on bed rest.  Keep track of the number of pads you use each day, how often you change pads, and how soaked (saturated) they are. Write this down.  Do not use tampons. Do not douche.  Do not have sexual intercourse or orgasms until approved by your health care provider.  If you pass any tissue from your vagina, save the tissue so you can show it to your health care provider.  Only take over-the-counter or prescription medicines as directed by your health care provider.  Do not take aspirin because it can make you  bleed.  Keep all follow-up appointments as directed by your health care provider. SEEK MEDICAL CARE IF:  You have any vaginal bleeding during any part of your pregnancy.  You have cramps or labor pains.  You have a fever, not controlled by medicine. SEEK IMMEDIATE MEDICAL CARE IF:   You have severe cramps in your back or belly (abdomen).  You pass large clots or tissue from your vagina.  Your bleeding increases.  You feel light-headed or weak, or you have fainting episodes.  You have chills.  You are leaking fluid or have a gush of fluid from your vagina.  You pass out while having a bowel movement. MAKE SURE YOU:  Understand these instructions.  Will watch your condition.  Will get help right away if you are not doing well or get worse.   This information is not intended to replace advice given to you by your health care provider. Make sure you discuss any questions you have with your health care provider.   Document Released: 06/21/2005 Document Revised: 09/16/2013 Document Reviewed: 05/19/2013 Elsevier Interactive Patient Education 2016 Elsevier Inc.   Bacterial Vaginosis Bacterial vaginosis is a vaginal infection that occurs when the normal balance of bacteria in the vagina is disrupted. It results from an overgrowth of certain bacteria. This is the most common vaginal infection in women of childbearing age. Treatment is important to prevent complications, especially in pregnant women, as it can cause a premature delivery. CAUSES  Bacterial vaginosis is caused by an increase in harmful bacteria that are normally present in smaller amounts in the  vagina. Several different kinds of bacteria can cause bacterial vaginosis. However, the reason that the condition develops is not fully understood. RISK FACTORS Certain activities or behaviors can put you at an increased risk of developing bacterial vaginosis, including:  Having a new sex partner or multiple sex  partners.  Douching.  Using an intrauterine device (IUD) for contraception. Women do not get bacterial vaginosis from toilet seats, bedding, swimming pools, or contact with objects around them. SIGNS AND SYMPTOMS  Some women with bacterial vaginosis have no signs or symptoms. Common symptoms include:  Grey vaginal discharge.  A fishlike odor with discharge, especially after sexual intercourse.  Itching or burning of the vagina and vulva.  Burning or pain with urination. DIAGNOSIS  Your health care provider will take a medical history and examine the vagina for signs of bacterial vaginosis. A sample of vaginal fluid may be taken. Your health care provider will look at this sample under a microscope to check for bacteria and abnormal cells. A vaginal pH test may also be done.  TREATMENT  Bacterial vaginosis may be treated with antibiotic medicines. These may be given in the form of a pill or a vaginal cream. A second round of antibiotics may be prescribed if the condition comes back after treatment. Because bacterial vaginosis increases your risk for sexually transmitted diseases, getting treated can help reduce your risk for chlamydia, gonorrhea, HIV, and herpes. HOME CARE INSTRUCTIONS   Only take over-the-counter or prescription medicines as directed by your health care provider.  If antibiotic medicine was prescribed, take it as directed. Make sure you finish it even if you start to feel better.  Tell all sexual partners that you have a vaginal infection. They should see their health care provider and be treated if they have problems, such as a mild rash or itching.  During treatment, it is important that you follow these instructions:  Avoid sexual activity or use condoms correctly.  Do not douche.  Avoid alcohol as directed by your health care provider.  Avoid breastfeeding as directed by your health care provider. SEEK MEDICAL CARE IF:   Your symptoms are not improving  after 3 days of treatment.  You have increased discharge or pain.  You have a fever. MAKE SURE YOU:   Understand these instructions.  Will watch your condition.  Will get help right away if you are not doing well or get worse. FOR MORE INFORMATION  Centers for Disease Control and Prevention, Division of STD Prevention: SolutionApps.co.za American Sexual Health Association (ASHA): www.ashastd.org    This information is not intended to replace advice given to you by your health care provider. Make sure you discuss any questions you have with your health care provider.   Document Released: 09/11/2005 Document Revised: 10/02/2014 Document Reviewed: 04/23/2013 Elsevier Interactive Patient Education 2016 Elsevier Inc.   Monilial Vaginitis Vaginitis in a soreness, swelling and redness (inflammation) of the vagina and vulva. Monilial vaginitis is not a sexually transmitted infection. CAUSES  Yeast vaginitis is caused by yeast (candida) that is normally found in your vagina. With a yeast infection, the candida has overgrown in number to a point that upsets the chemical balance. SYMPTOMS   White, thick vaginal discharge.  Swelling, itching, redness and irritation of the vagina and possibly the lips of the vagina (vulva).  Burning or painful urination.  Painful intercourse. DIAGNOSIS  Things that may contribute to monilial vaginitis are:  Postmenopausal and virginal states.  Pregnancy.  Infections.  Being tired, sick or  stressed, especially if you had monilial vaginitis in the past.  Diabetes. Good control will help lower the chance.  Birth control pills.  Tight fitting garments.  Using bubble bath, feminine sprays, douches or deodorant tampons.  Taking certain medications that kill germs (antibiotics).  Sporadic recurrence can occur if you become ill. TREATMENT  Your caregiver will give you medication.  There are several kinds of anti monilial vaginal creams and  suppositories specific for monilial vaginitis. For recurrent yeast infections, use a suppository or cream in the vagina 2 times a week, or as directed.  Anti-monilial or steroid cream for the itching or irritation of the vulva may also be used. Get your caregiver's permission.  Painting the vagina with methylene blue solution may help if the monilial cream does not work.  Eating yogurt may help prevent monilial vaginitis. HOME CARE INSTRUCTIONS   Finish all medication as prescribed.  Do not have sex until treatment is completed or after your caregiver tells you it is okay.  Take warm sitz baths.  Do not douche.  Do not use tampons, especially scented ones.  Wear cotton underwear.  Avoid tight pants and panty hose.  Tell your sexual partner that you have a yeast infection. They should go to their caregiver if they have symptoms such as mild rash or itching.  Your sexual partner should be treated as well if your infection is difficult to eliminate.  Practice safer sex. Use condoms.  Some vaginal medications cause latex condoms to fail. Vaginal medications that harm condoms are:  Cleocin cream.  Butoconazole (Femstat).  Terconazole (Terazol) vaginal suppository.  Miconazole (Monistat) (may be purchased over the counter). SEEK MEDICAL CARE IF:   You have a temperature by mouth above 102 F (38.9 C).  The infection is getting worse after 2 days of treatment.  The infection is not getting better after 3 days of treatment.  You develop blisters in or around your vagina.  You develop vaginal bleeding, and it is not your menstrual period.  You have pain when you urinate.  You develop intestinal problems.  You have pain with sexual intercourse.   This information is not intended to replace advice given to you by your health care provider. Make sure you discuss any questions you have with your health care provider.   Document Released: 06/21/2005 Document Revised:  12/04/2011 Document Reviewed: 03/15/2015 Elsevier Interactive Patient Education [employer]

## 2015-07-07 NOTE — ED Notes (Signed)
Pelvic cart set up 

## 2015-07-07 NOTE — ED Provider Notes (Signed)
TIME SEEN: 5:35 AM  CHIEF COMPLAINT: Vaginal bleeding, abdominal pain  HPI: Pt is a 24 y.o. G1 who presents to the emergency department with complaints of lower abdominal pain, vaginal bleeding that started off as a spotting and has increased since last night. Also feels like she is having burning when she urinates. No vaginal discharge. No fever, nausea, vomiting or diarrhea. Patient is 11 weeks and 4 days by LMP. Has had an ultrasound with her OB/GYN in the emergency department confirming an IUP. Has an appointment with a OB/GYN in [facility] today. Has had Trichomonas in the past. Is currently sexually active. Last sexual intercourse was this morning. No prior abdominal surgery.  ROS: See HPI Constitutional: no fever  Eyes: no drainage  ENT: no runny nose   Cardiovascular:  no chest pain  Resp: no SOB  GI: no vomiting GU: no dysuria Integumentary: no rash  Allergy: no hives  Musculoskeletal: no leg swelling  Neurological: no slurred speech ROS otherwise negative  PAST MEDICAL HISTORY/PAST SURGICAL HISTORY:  Past Medical History  Diagnosis Date  . Medical history non-contributory     MEDICATIONS:  Prior to Admission medications   Medication Sig Start Date End Date Taking? Authorizing Provider  Prenatal Vit-Fe Fumarate-FA (PRENATAL COMPLETE) 14-0.4 MG TABS Take 1 tablet by mouth daily. 05/24/15  Yes [name], PA-C  metroNIDAZOLE (FLAGYL) 500 MG tablet Take 1 tablet (500 mg total) by mouth 2 (two) times daily. Patient not taking: Reported on 06/23/2015 06/13/15   Archie Patten, CNM  promethazine (PHENERGAN) 25 MG tablet Take 1 tablet (25 mg total) by mouth every 6 (six) hours as needed for nausea or vomiting. Patient not taking: Reported on 07/07/2015 06/13/15   Archie Patten, CNM    ALLERGIES:  Allergies  Allergen Reactions  . Promethazine Other (See Comments)    Stomach felt hot, felt sleepy, uncomfortable feelings    SOCIAL HISTORY:  Social History  Substance Use  Topics  . Smoking status: Never Smoker   . Smokeless tobacco: Not on file  . Alcohol Use: No    FAMILY HISTORY: History reviewed. No pertinent family history.  EXAM: BP 122/78 mmHg  Pulse 85  Temp(Src) 98.2 F (36.8 C) (Oral)  Resp 14  Ht  (1.753 m)  Wt 125 lb (56.7 kg)  BMI 18.45 kg/m2  SpO2 99%  LMP 04/22/2015 (Approximate) CONSTITUTIONAL: Alert and oriented and responds appropriately to questions. Well-appearing; well-nourished HEAD: Normocephalic EYES: Conjunctivae clear, PERRL ENT: normal nose; no rhinorrhea; moist mucous membranes; pharynx without lesions noted NECK: Supple, no meningismus, no LAD  CARD: RRR; S1 and S2 appreciated; no murmurs, no clicks, no rubs, no gallops RESP: Normal chest excursion without splinting or tachypnea; breath sounds clear and equal bilaterally; no wheezes, no rhonchi, no rales, no hypoxia or respiratory distress, speaking full sentences ABD/GI: Normal bowel sounds; non-distended; soft, non-tender, no rebound, no guarding, no peritoneal signs GU: Normal external genitalia, patient has a small amount of white vaginal discharge on exam, no vaginal bleeding, cervix is thick closed and high, no adnexal tenderness or fullness, no cervical motion tenderness BACK:  The back appears normal and is non-tender to palpation, there is no CVA tenderness EXT: Normal ROM in all joints; non-tender to palpation; no edema; normal capillary refill; no cyanosis, no calf tenderness or swelling    SKIN: Normal color for age and race; warm NEURO: Moves all extremities equally, sensation to light touch intact diffusely, cranial nerves II through XII intact PSYCH: The  patient's mood and manner are appropriate. Grooming and personal hygiene are appropriate.  MEDICAL DECISION MAKING: Patient here with vaginal bleeding that seems to have improved on exam, abdominal pain and is 11 weeks and 4 days pregnant. She has had a ultrasound confirming an IUP in the past. Will  send pelvic cultures, urine and transvaginal ultrasound. We'll give Tylenol for pain.  ED PROGRESS: Patient's urine shows trace leukocytes but no other sign of infection. Blood type is A+, she does not need RhoGAM. Wet prep shows yeast and clue cells. We'll treat with Diflucan and Flagyl. Transvaginal ultrasound pending. Signed out to oncoming provider to follow-up and ultrasound results. Anticipate discharge home with close outpatient follow-up, patient reports she has an appointment [facility].     Layla MawKristen N Cornellius Kropp, DO 07/07/15 0830

## 2015-07-07 NOTE — ED Notes (Signed)
Pt is in stable condition upon d/c and ambulates from ED. 

## 2015-07-07 NOTE — ED Notes (Addendum)
Patient c/o vaginal spotting since last night, starting out as pinkish color and light, then progressing to more of a red spotting.  Patient also c/o burning sensation and swelling of labia since last night as well.  Patient is [redacted] weeks pregnant and sexually active (last intercourse this morning).  Patient denies other discharge.

## 2015-07-11 LAB — OB RESULTS CONSOLE HEPATITIS B SURFACE ANTIGEN: Hepatitis B Surface Ag: NEGATIVE

## 2015-07-11 LAB — OB RESULTS CONSOLE RUBELLA ANTIBODY, IGM: Rubella: NON-IMMUNE/NOT IMMUNE

## 2015-09-04 ENCOUNTER — Emergency Department (HOSPITAL_COMMUNITY)
Admission: EM | Admit: 2015-09-04 | Discharge: 2015-09-05 | Disposition: A | Discharge status: Home / Self Care | Payer: Medicaid Other | Attending: Emergency Medicine | Admitting: Emergency Medicine

## 2015-09-04 ENCOUNTER — Encounter (HOSPITAL_COMMUNITY): Payer: Self-pay | Admitting: Emergency Medicine

## 2015-09-04 DIAGNOSIS — O23592 Infection of other part of genital tract in pregnancy, second trimester: Secondary | ICD-10-CM | POA: Diagnosis not present

## 2015-09-04 DIAGNOSIS — B373 Candidiasis of vulva and vagina: Secondary | ICD-10-CM | POA: Diagnosis not present

## 2015-09-04 DIAGNOSIS — N760 Acute vaginitis: Secondary | ICD-10-CM

## 2015-09-04 DIAGNOSIS — R1905 Periumbilic swelling, mass or lump: Secondary | ICD-10-CM | POA: Insufficient documentation

## 2015-09-04 DIAGNOSIS — R39198 Other difficulties with micturition: Secondary | ICD-10-CM | POA: Diagnosis not present

## 2015-09-04 DIAGNOSIS — M25552 Pain in left hip: Secondary | ICD-10-CM | POA: Diagnosis not present

## 2015-09-04 DIAGNOSIS — M549 Dorsalgia, unspecified: Secondary | ICD-10-CM | POA: Diagnosis not present

## 2015-09-04 DIAGNOSIS — Z3490 Encounter for supervision of normal pregnancy, unspecified, unspecified trimester: Secondary | ICD-10-CM

## 2015-09-04 DIAGNOSIS — B3731 Acute candidiasis of vulva and vagina: Secondary | ICD-10-CM

## 2015-09-04 DIAGNOSIS — Z3A20 20 weeks gestation of pregnancy: Secondary | ICD-10-CM | POA: Diagnosis not present

## 2015-09-04 DIAGNOSIS — B9689 Other specified bacterial agents as the cause of diseases classified elsewhere: Secondary | ICD-10-CM

## 2015-09-04 DIAGNOSIS — Z79899 Other long term (current) drug therapy: Secondary | ICD-10-CM | POA: Insufficient documentation

## 2015-09-04 DIAGNOSIS — R109 Unspecified abdominal pain: Secondary | ICD-10-CM

## 2015-09-04 DIAGNOSIS — O9989 Other specified diseases and conditions complicating pregnancy, childbirth and the puerperium: Secondary | ICD-10-CM | POA: Diagnosis present

## 2015-09-04 LAB — URINALYSIS, ROUTINE W REFLEX MICROSCOPIC
BILIRUBIN URINE: NEGATIVE
Glucose, UA: NEGATIVE mg/dL
Hgb urine dipstick: NEGATIVE
Ketones, ur: NEGATIVE mg/dL
NITRITE: NEGATIVE
PROTEIN: NEGATIVE mg/dL
SPECIFIC GRAVITY, URINE: 1.008 (ref 1.005–1.030)
pH: 7 (ref 5.0–8.0)

## 2015-09-04 LAB — URINE MICROSCOPIC-ADD ON

## 2015-09-04 MED ORDER — SODIUM CHLORIDE 0.9 % IV BOLUS (SEPSIS)
1000.0000 mL | Freq: Once | INTRAVENOUS | Status: AC
  Administered 2015-09-04: 1000 mL via INTRAVENOUS

## 2015-09-04 NOTE — ED Provider Notes (Signed)
CSN: [id number]     Arrival date & time 09/04/15  2136 History   First MD Initiated Contact with Patient 09/04/15 2145     Chief Complaint  Patient presents with  . Hip Pain  . Abdominal Pain     (Consider location/radiation/quality/duration/timing/severity/associated sxs/prior Treatment) Patient is a 24 y.o. female presenting with hip pain and abdominal pain. The history is provided by the patient.  Hip Pain Associated symptoms include abdominal pain.  Abdominal Pain Associated symptoms: no vaginal bleeding and no vaginal discharge   patient is 20 weeks and 2 days pregnant. For the last day or 2 she's been having some pain in her abdomen. She states it tightens up and then loosens up. It'll come around every 10 minutes. She still feels baby moving. No vaginal bleeding or discharge. States she has had some feeling that she needs to urinate. No fevers. No nausea vomiting. No vaginal fluid. She's not had pains like this before. She states she was told to come in by [facility], [facility]. And goes to her left groin and somewhat left hip area. Also radiates to the back. Past Medical History  Diagnosis Date  . Medical history non-contributory    Past Surgical History  Procedure Laterality Date  . No past surgeries     No family history on file. Social History  Substance Use Topics  . Smoking status: Never Smoker   . Smokeless tobacco: None  . Alcohol Use: No   OB History    Gravida Para Term Preterm AB TAB SAB Ectopic Multiple Living   1 0        0     Review of Systems  Constitutional: Negative for appetite change.  Eyes: Negative for redness.  Gastrointestinal: Positive for abdominal pain.  Genitourinary: Positive for difficulty urinating. Negative for flank pain, vaginal bleeding, vaginal discharge, vaginal pain and menstrual problem.  Musculoskeletal: Positive for back pain.      Allergies  Promethazine  Home Medications   Prior to Admission medications    Medication Sig Start Date End Date Taking? Authorizing Provider  Prenatal Vit-Fe Fumarate-FA (PRENATAL COMPLETE) 14-0.4 MG TABS Take 1 tablet by mouth daily. 05/24/15  Yes Trixie DredgeEmily West, PA-C   BP 117/78 mmHg  Pulse 84  Temp(Src) 97.5 F (36.4 C) (Oral)  Resp 16  Ht 5\' 8"  (1.727 m)  Wt 131 lb (59.421 kg)  BMI 19.92 kg/m2  SpO2 100%  LMP 04/22/2015 (Approximate) Physical Exam  Constitutional: She appears well-developed.  HENT:  Head: Normocephalic.  Cardiovascular: Normal rate.   Pulmonary/Chest: Effort normal.  Abdominal: She exhibits mass.  Mass to near umbilical area.  Musculoskeletal: Normal range of motion.  Neurological: She is alert.  Skin: Skin is warm.  os closed. Cervix mildly soft. Thick white vaginal discharge.   ED Course  Procedures (including critical care time) Labs Review Labs Reviewed  URINALYSIS, ROUTINE W REFLEX MICROSCOPIC (NOT AT [facility]) - Abnormal; Notable for the following:    Leukocytes, UA SMALL (*)    All other components within normal limits  URINE MICROSCOPIC-ADD ON - Abnormal; Notable for the following:    Squamous Epithelial / LPF 0-5 (*)    Bacteria, UA FEW (*)    All other components within normal limits  URINE CULTURE  WET PREP, GENITAL    Imaging Review No results found. I have personally reviewed and evaluated these images and lab results as part of my medical decision-making.   EKG Interpretation None  MDM   Final diagnoses:  Pregnant  Abdominal pain, unspecified abdominal location   Patient with regular contracting pain. [redacted] weeks pregnant. Os closed and baby moving. FHR 150. Decreased contractions with IV fluids. Wet prep pending since yeast in urine. Care turned over to DR Ward.     Benjiman Core, MD 09/05/15 [id number]

## 2015-09-04 NOTE — ED Notes (Addendum)
Pt reports L hip pain x 2 days. Denies injury. Tonight she began to have lower abdominal "tightness" and urinary urgency. Denies vaginal discharge/bleeding. Pt is 5 months pregnant. Also c/o pain in buttox.

## 2015-09-04 NOTE — ED Notes (Signed)
Repositioned monitor and  HR 151

## 2015-09-05 LAB — WET PREP, GENITAL
Sperm: NONE SEEN
TRICH WET PREP: NONE SEEN

## 2015-09-05 LAB — OB RESULTS CONSOLE GC/CHLAMYDIA
CHLAMYDIA, DNA PROBE: NEGATIVE
Gonorrhea: NEGATIVE

## 2015-09-05 MED ORDER — METRONIDAZOLE 500 MG PO TABS
500.0000 mg | ORAL_TABLET | Freq: Once | ORAL | Status: AC
Start: 2015-09-05 — End: 2015-09-05
  Administered 2015-09-05: 500 mg via ORAL
  Filled 2015-09-05: qty 1

## 2015-09-05 MED ORDER — FLUCONAZOLE 100 MG PO TABS
150.0000 mg | ORAL_TABLET | Freq: Once | ORAL | Status: AC
  Administered 2015-09-05: 150 mg via ORAL
  Filled 2015-09-05: qty 2

## 2015-09-05 MED ORDER — METRONIDAZOLE 500 MG PO TABS: 500.0000 mg | ORAL_TABLET | Freq: Two times a day (BID) | ORAL | Status: DC

## 2015-09-05 NOTE — ED Provider Notes (Signed)
12:30 AM Assumed care from Dr. Rubin PayorPickering.  Pt is a 24 y.o. female who is 20 weeks and 2 days pregnant by ultrasound who presents to the emergency department with abdominal pain, possible Deberah PeltonBraxton Hicks. No contractions seen on tocometry by our nursing staff. No vaginal bleeding, leaking fluid. [facility]. Patient does better after IV fluids. Pelvic showed a closed cervical os was no bleeding. She does have yeast and bacterial vaginosis and does have thick vaginal discharge on exam. Will treat for both. Advised her to increase her water intake and follow-up with her OB/GYN. Discussed return precautions. She verbalized understanding and is comfortable with this plan.  Layla MawKristen N Kameah Rawl, DO 09/05/15 [id number]

## 2015-09-05 NOTE — Discharge Instructions (Signed)
Bacterial Vaginosis Bacterial vaginosis is a vaginal infection that occurs when the normal balance of bacteria in the vagina is disrupted. It results from an overgrowth of certain bacteria. This is the most common vaginal infection in women of childbearing age. Treatment is important to prevent complications, especially in pregnant women, as it can cause a premature delivery. CAUSES  Bacterial vaginosis is caused by an increase in harmful bacteria that are normally present in smaller amounts in the vagina. Several different kinds of bacteria can cause bacterial vaginosis. However, the reason that the condition develops is not fully understood. RISK FACTORS Certain activities or behaviors can put you at an increased risk of developing bacterial vaginosis, including:  Having a new sex partner or multiple sex partners.  Douching.  Using an intrauterine device (IUD) for contraception. Women do not get bacterial vaginosis from toilet seats, bedding, swimming pools, or contact with objects around them. SIGNS AND SYMPTOMS  Some women with bacterial vaginosis have no signs or symptoms. Common symptoms include:  Grey vaginal discharge.  A fishlike odor with discharge, especially after sexual intercourse.  Itching or burning of the vagina and vulva.  Burning or pain with urination. DIAGNOSIS  Your health care provider will take a medical history and examine the vagina for signs of bacterial vaginosis. A sample of vaginal fluid may be taken. Your health care provider will look at this sample under a microscope to check for bacteria and abnormal cells. A vaginal pH test may also be done.  TREATMENT  Bacterial vaginosis may be treated with antibiotic medicines. These may be given in the form of a pill or a vaginal cream. A second round of antibiotics may be prescribed if the condition comes back after treatment. Because bacterial vaginosis increases your risk for sexually transmitted diseases, getting  treated can help reduce your risk for chlamydia, gonorrhea, HIV, and herpes. HOME CARE INSTRUCTIONS   Only take over-the-counter or prescription medicines as directed by your health care provider.  If antibiotic medicine was prescribed, take it as directed. Make sure you finish it even if you start to feel better.  Tell all sexual partners that you have a vaginal infection. They should see their health care provider and be treated if they have problems, such as a mild rash or itching.  During treatment, it is important that you follow these instructions:  Avoid sexual activity or use condoms correctly.  Do not douche.  Avoid alcohol as directed by your health care provider.  Avoid breastfeeding as directed by your health care provider. SEEK MEDICAL CARE IF:   Your symptoms are not improving after 3 days of treatment.  You have increased discharge or pain.  You have a fever. MAKE SURE YOU:   Understand these instructions.  Will watch your condition.  Will get help right away if you are not doing well or get worse. FOR MORE INFORMATION  Centers for Disease Control and Prevention, Division of STD Prevention: www.cdc.gov/std American Sexual Health Association (ASHA): www.ashastd.org    This information is not intended to replace advice given to you by your health care provider. Make sure you discuss any questions you have with your health care provider.   Document Released: 09/11/2005 Document Revised: 10/02/2014 Document Reviewed: 04/23/2013 Elsevier Interactive Patient Education 2016 [employer] Monilial Vaginitis Vaginitis in a soreness, swelling and redness (inflammation) of the vagina and vulva. Monilial vaginitis is not a sexually transmitted infection. CAUSES  Yeast vaginitis is caused by yeast (candida) that is normally found in   in your vagina. With a yeast infection, the candida has overgrown in number to a point that upsets the chemical balance. SYMPTOMS   White,  thick vaginal discharge.  Swelling, itching, redness and irritation of the vagina and possibly the lips of the vagina (vulva).  Burning or painful urination.  Painful intercourse. DIAGNOSIS  Things that may contribute to monilial vaginitis are:  Postmenopausal and virginal states.  Pregnancy.  Infections.  Being tired, sick or stressed, especially if you had monilial vaginitis in the past.  Diabetes. Good control will help lower the chance.  Birth control pills.  Tight fitting garments.  Using bubble bath, feminine sprays, douches or deodorant tampons.  Taking certain medications that kill germs (antibiotics).  Sporadic recurrence can occur if you become ill. TREATMENT  Your caregiver will give you medication.  There are several kinds of anti monilial vaginal creams and suppositories specific for monilial vaginitis. For recurrent yeast infections, use a suppository or cream in the vagina 2 times a week, or as directed.  Anti-monilial or steroid cream for the itching or irritation of the vulva may also be used. Get your caregiver's permission.  Painting the vagina with methylene blue solution may help if the monilial cream does not work.  Eating yogurt may help prevent monilial vaginitis. HOME CARE INSTRUCTIONS   Finish all medication as prescribed.  Do not have sex until treatment is completed or after your caregiver tells you it is okay.  Take warm sitz baths.  Do not douche.  Do not use tampons, especially scented ones.  Wear cotton underwear.  Avoid tight pants and panty hose.  Tell your sexual partner that you have a yeast infection. They should go to their caregiver if they have symptoms such as mild rash or itching.  Your sexual partner should be treated as well if your infection is difficult to eliminate.  Practice safer sex. Use condoms.  Some vaginal medications cause latex condoms to fail. Vaginal medications that harm condoms are:  Cleocin  cream.  Butoconazole (Femstat).  Terconazole (Terazol) vaginal suppository.  Miconazole (Monistat) (may be purchased over the counter). SEEK MEDICAL CARE IF:   You have a temperature by mouth above 102 F (38.9 C).  The infection is getting worse after 2 days of treatment.  The infection is not getting better after 3 days of treatment.  You develop blisters in or around your vagina.  You develop vaginal bleeding, and it is not your menstrual period.  You have pain when you urinate.  You develop intestinal problems.  You have pain with sexual intercourse.   This information is not intended to replace advice given to you by your health care provider. Make sure you discuss any questions you have with your health care provider.   Document Released: 06/21/2005 Document Revised: 12/04/2011 Document Reviewed: 03/15/2015 Elsevier Interactive Patient Education 2016 [employer].  Second Trimester of Pregnancy The second trimester is from week 13 through week 28, months 4 through 6. The second trimester is often a time when you feel your best. Your body has also adjusted to being pregnant, and you begin to feel better physically. Usually, morning sickness has lessened or quit completely, you may have more energy, and you may have an increase in appetite. The second trimester is also a time when the fetus is growing rapidly. At the end of the sixth month, the fetus is about 9 inches long and weighs about 1 pounds. You will likely begin to feel the baby move (quickening)  between 18 and 20 weeks of the pregnancy. BODY CHANGES Your body goes through many changes during pregnancy. The changes vary from woman to woman.   Your weight will continue to increase. You will notice your lower abdomen bulging out.  You may begin to get stretch marks on your hips, abdomen, and breasts.  You may develop headaches that can be relieved by medicines approved by your health care provider.  You may  urinate more often because the fetus is pressing on your bladder.  You may develop or continue to have heartburn as a result of your pregnancy.  You may develop constipation because certain hormones are causing the muscles that push waste through your intestines to slow down.  You may develop hemorrhoids or swollen, bulging veins (varicose veins).  You may have back pain because of the weight gain and pregnancy hormones relaxing your joints between the bones in your pelvis and as a result of a shift in weight and the muscles that support your balance.  Your breasts will continue to grow and be tender.  Your gums may bleed and may be sensitive to brushing and flossing.  Dark spots or blotches (chloasma, mask of pregnancy) may develop on your face. This will likely fade after the baby is born.  A dark line from your belly button to the pubic area (linea nigra) may appear. This will likely fade after the baby is born.  You may have changes in your hair. These can include thickening of your hair, rapid growth, and changes in texture. Some women also have hair loss during or after pregnancy, or hair that feels dry or thin. Your hair will most likely return to normal after your baby is born. WHAT TO EXPECT AT YOUR PRENATAL VISITS During a routine prenatal visit:  You will be weighed to make sure you and the fetus are growing normally.  Your blood pressure will be taken.  Your abdomen will be measured to track your baby's growth.  The fetal heartbeat will be listened to.  Any test results from the previous visit will be discussed. Your health care provider may ask you:  How you are feeling.  If you are feeling the baby move.  If you have had any abnormal symptoms, such as leaking fluid, bleeding, severe headaches, or abdominal cramping.  If you are using any tobacco products, including cigarettes, chewing tobacco, and electronic cigarettes.  If you have any questions. Other tests  that may be performed during your second trimester include:  Blood tests that check for:  Low iron levels (anemia).  Gestational diabetes (between 24 and 28 weeks).  Rh antibodies.  Urine tests to check for infections, diabetes, or protein in the urine.  An ultrasound to confirm the proper growth and development of the baby.  An amniocentesis to check for possible genetic problems.  Fetal screens for spina bifida and Down syndrome.  HIV (human immunodeficiency virus) testing. Routine prenatal testing includes screening for HIV, unless you choose not to have this test. HOME CARE INSTRUCTIONS   Avoid all smoking, herbs, alcohol, and unprescribed drugs. These chemicals affect the formation and growth of the baby.  Do not use any tobacco products, including cigarettes, chewing tobacco, and electronic cigarettes. If you need help quitting, ask your health care provider. You may receive counseling support and other resources to help you quit.  Follow your health care provider's instructions regarding medicine use. There are medicines that are either safe or unsafe to take during pregnancy.  Exercise only as directed by your health care provider. Experiencing uterine cramps is a good sign to stop exercising.  Continue to eat regular, healthy meals.  Wear a good support bra for breast tenderness.  Do not use hot tubs, steam rooms, or saunas.  Wear your seat belt at all times when driving.  Avoid raw meat, uncooked cheese, cat litter boxes, and soil used by cats. These carry germs that can cause birth defects in the baby.  Take your prenatal vitamins.  Take 1500-2000 mg of calcium daily starting at the 20th week of pregnancy until you deliver your baby.  Try taking a stool softener (if your health care provider approves) if you develop constipation. Eat more high-fiber foods, such as fresh vegetables or fruit and whole grains. Drink plenty of fluids to keep your urine clear or pale  yellow.  Take warm sitz baths to soothe any pain or discomfort caused by hemorrhoids. Use hemorrhoid cream if your health care provider approves.  If you develop varicose veins, wear support hose. Elevate your feet for 15 minutes, 3-4 times a day. Limit salt in your diet.  Avoid heavy lifting, wear low heel shoes, and practice good posture.  Rest with your legs elevated if you have leg cramps or low back pain.  Visit your dentist if you have not gone yet during your pregnancy. Use a soft toothbrush to brush your teeth and be gentle when you floss.  A sexual relationship may be continued unless your health care provider directs you otherwise.  Continue to go to all your prenatal visits as directed by your health care provider. SEEK MEDICAL CARE IF:   You have dizziness.  You have mild pelvic cramps, pelvic pressure, or nagging pain in the abdominal area.  You have persistent nausea, vomiting, or diarrhea.  You have a bad smelling vaginal discharge.  You have pain with urination. SEEK IMMEDIATE MEDICAL CARE IF:   You have a fever.  You are leaking fluid from your vagina.  You have spotting or bleeding from your vagina.  You have severe abdominal cramping or pain.  You have rapid weight gain or loss.  You have shortness of breath with chest pain.  You notice sudden or extreme swelling of your face, hands, ankles, feet, or legs.  You have not felt your baby move in over an hour.  You have severe headaches that do not go away with medicine.  You have vision changes.   This information is not intended to replace advice given to you by your health care provider. Make sure you discuss any questions you have with your health care provider.   Document Released: 09/05/2001 Document Revised: 10/02/2014 Document Reviewed: 11/12/2012 Elsevier Interactive Patient Education 2016 Elsevier Inc.  Back Pain in Pregnancy Back pain during pregnancy is common. It happens in about half  of all pregnancies. It is important for you and your baby that you remain active during your pregnancy.If you feel that back pain is not allowing you to remain active or sleep well, it is time to see your caregiver. Back pain may be caused by several factors related to changes during your pregnancy.Fortunately, unless you had trouble with your back before your pregnancy, the pain is likely to get better after you deliver. Low back pain usually occurs between the fifth and seventh months of pregnancy. It can, however, happen in the first couple months. Factors that increase the risk of back problems include:   Previous back problems.  Injury to  your back.  Having twins or multiple births.  A chronic cough.  Stress.  Job-related repetitive motions.  Muscle or spinal disease in the back.  Family history of back problems, ruptured (herniated) discs, or osteoporosis.  Depression, anxiety, and panic attacks. CAUSES   When you are pregnant, your body produces a hormone called relaxin. This hormonemakes the ligaments connecting the low back and pubic bones more flexible. This flexibility allows the baby to be delivered more easily. When your ligaments are loose, your muscles need to work harder to support your back. Soreness in your back can come from tired muscles. Soreness can also come from back tissues that are irritated since they are receiving less support.  As the baby grows, it puts pressure on the nerves and blood vessels in your pelvis. This can cause back pain.  As the baby grows and gets heavier during pregnancy, the uterus pushes the stomach muscles forward and changes your center of gravity. This makes your back muscles work harder to maintain good posture. SYMPTOMS  Lumbar pain during pregnancy Lumbar pain during pregnancy usually occurs at or above the waist in the center of the back. There may be pain and numbness that radiates into your leg or foot. This is similar to low  back pain experienced by non-pregnant women. It usually increases with sitting for long periods of time, standing, or repetitive lifting. Tenderness may also be present in the muscles along your upper back. Posterior pelvic pain during pregnancy Pain in the back of the pelvis is more common than lumbar pain in pregnancy. It is a deep pain felt in your side at the waistline, or across the tailbone (sacrum), or in both places. You may have pain on one or both sides. This pain can also go into the buttocks and backs of the upper thighs. Pubic and groin pain may also be present. The pain does not quickly resolve with rest, and morning stiffness may also be present. Pelvic pain during pregnancy can be brought on by most activities. A high level of fitness before and during pregnancy may or may not prevent this problem. Labor pain is usually 1 to 2 minutes apart, lasts for about 1 minute, and involves a bearing down feeling or pressure in your pelvis. However, if you are at term with the pregnancy, constant low back pain can be the beginning of early labor, and you should be aware of this. DIAGNOSIS  X-rays of the back should not be done during the first 12 to 14 weeks of the pregnancy and only when absolutely necessary during the rest of the pregnancy. MRIs do not give off radiation and are safe during pregnancy. MRIs also should only be done when absolutely necessary. HOME CARE INSTRUCTIONS  Exercise as directed by your caregiver. Exercise is the most effective way to prevent or manage back pain. If you have a back problem, it is especially important to avoid sports that require sudden body movements. Swimming and walking are great activities.  Do not stand in one place for long periods of time.  Do not wear high heels.  Sit in chairs with good posture. Use a pillow on your lower back if necessary. Make sure your head rests over your shoulders and is not hanging forward.  Try sleeping on your side,  preferably the left side, with a pillow or two between your legs. If you are sore after a night's rest, your bedmay betoo soft.Try placing a board between your mattress and box spring.  Listen to your body when lifting.If you are experiencing pain, ask for help or try bending yourknees more so you can use your leg muscles rather than your back muscles. Squat down when picking up something from the floor. Do not bend over.  Eat a healthy diet. Try to gain weight within your caregiver's recommendations.  Use heat or cold packs 3 to 4 times a day for 15 minutes to help with the pain.  Only take over-the-counter or prescription medicines for pain, discomfort, or fever as directed by your caregiver. Sudden (acute) back pain  Use bed rest for only the most extreme, acute episodes of back pain. Prolonged bed rest over 48 hours will aggravate your condition.  Ice is very effective for acute conditions.  Put ice in a plastic bag.  Place a towel between your skin and the bag.  Leave the ice on for 10 to 20 minutes every 2 hours, or as needed.  Using heat packs for 30 minutes prior to activities is also helpful. Continued back pain See your caregiver if you have continued problems. Your caregiver can help or refer you for appropriate physical therapy. With conditioning, most back problems can be avoided. Sometimes, a more serious issue may be the cause of back pain. You should be seen right away if new problems seem to be developing. Your caregiver may recommend:  A maternity girdle.  An elastic sling.  A back brace.  A massage therapist or acupuncture. SEEK MEDICAL CARE IF:   You are not able to do most of your daily activities, even when taking the pain medicine you were given.  You need a referral to a physical therapist or chiropractor.  You want to try acupuncture. SEEK IMMEDIATE MEDICAL CARE IF:  You develop numbness, tingling, weakness, or problems with the use of your arms  or legs.  You develop severe back pain that is no longer relieved with medicines.  You have a sudden change in bowel or bladder control.  You have increasing pain in other areas of the body.  You develop shortness of breath, dizziness, or fainting.  You develop nausea, vomiting, or sweating.  You have back pain which is similar to labor pains.  You have back pain along with your water breaking or vaginal bleeding.  You have back pain or numbness that travels down your leg.  Your back pain developed after you fell.  You develop pain on one side of your back. You may have a kidney stone.  You see blood in your urine. You may have a bladder infection or kidney stone.  You have back pain with blisters. You may have shingles. Back pain is fairly common during pregnancy but should not be accepted as just part of the process. Back pain should always be treated as soon as possible. This will make your pregnancy as pleasant as possible.   This information is not intended to replace advice given to you by your health care provider. Make sure you discuss any questions you have with your health care provider.   Document Released: 12/20/2005 Document Revised: 12/04/2011 Document Reviewed: 01/31/2011 Elsevier Interactive Patient Education [employer]

## 2015-09-06 LAB — URINE CULTURE

## 2015-09-06 LAB — GC/CHLAMYDIA PROBE AMP (~~LOC~~) NOT AT ARMC
Chlamydia: NEGATIVE
Neisseria Gonorrhea: NEGATIVE

## 2015-09-26 NOTE — L&D Delivery Note (Signed)
Delivery Note Pt was augmented with a Foley bulb and Cytotec. She progressed normally. At 3:17 AM a viable female was delivered via Vaginal, Spontaneous Delivery (Presentation: Left Occiput Anterior). Tight nuchal cord x 1, delivered through.  APGAR: 9, 9; weight pending.   Placenta status: Intact, Spontaneous.  Cord: 3 vessels with the following complications: None.  Cord pH: n/a  Anesthesia: Local  Episiotomy: None Lacerations: Sulcus Suture Repair: 2.0 vicryl Est. Blood Loss (mL): 100  Mom to postpartum.  Baby to Couplet care / Skin to Skin.  Jinny BlossomKaty D Mayo 01/12/2016, 3:50 AM   OB fellow attestation:  I have seen and examined this patient; I agree with above documentation in the resident's note. I directly supervised repair of the right sulcal laceration in the typical fashion.   Federico FlakeKimberly Niles Jaiyla Granados, MD 7:45 AM

## 2016-01-11 ENCOUNTER — Inpatient Hospital Stay (HOSPITAL_COMMUNITY)
Admission: EM | Admit: 2016-01-11 | Discharge: 2016-01-13 | DRG: 775 | Disposition: A | Discharge status: Home / Self Care | Payer: Medicaid Other | Attending: Family Medicine | Admitting: Family Medicine

## 2016-01-11 ENCOUNTER — Encounter (HOSPITAL_COMMUNITY): Payer: Self-pay | Admitting: *Deleted

## 2016-01-11 DIAGNOSIS — O4202 Full-term premature rupture of membranes, onset of labor within 24 hours of rupture: Secondary | ICD-10-CM | POA: Diagnosis not present

## 2016-01-11 DIAGNOSIS — O99344 Other mental disorders complicating childbirth: Secondary | ICD-10-CM | POA: Diagnosis not present

## 2016-01-11 DIAGNOSIS — Z833 Family history of diabetes mellitus: Secondary | ICD-10-CM | POA: Diagnosis not present

## 2016-01-11 DIAGNOSIS — Z283 Underimmunization status: Secondary | ICD-10-CM

## 2016-01-11 DIAGNOSIS — R109 Unspecified abdominal pain: Secondary | ICD-10-CM

## 2016-01-11 DIAGNOSIS — F329 Major depressive disorder, single episode, unspecified: Secondary | ICD-10-CM | POA: Diagnosis present

## 2016-01-11 DIAGNOSIS — O9989 Other specified diseases and conditions complicating pregnancy, childbirth and the puerperium: Secondary | ICD-10-CM

## 2016-01-11 DIAGNOSIS — Z3A38 38 weeks gestation of pregnancy: Secondary | ICD-10-CM | POA: Diagnosis not present

## 2016-01-11 DIAGNOSIS — Z2839 Other underimmunization status: Secondary | ICD-10-CM

## 2016-01-11 DIAGNOSIS — O26899 Other specified pregnancy related conditions, unspecified trimester: Secondary | ICD-10-CM

## 2016-01-11 DIAGNOSIS — IMO0001 Reserved for inherently not codable concepts without codable children: Secondary | ICD-10-CM

## 2016-01-11 LAB — POCT FERN TEST

## 2016-01-11 LAB — CBC
HEMATOCRIT: 36.8 % (ref 36.0–46.0)
Hemoglobin: 13.4 g/dL (ref 12.0–15.0)
MCH: 34.2 pg — AB (ref 26.0–34.0)
MCHC: 36.4 g/dL — ABNORMAL HIGH (ref 30.0–36.0)
MCV: 93.9 fL (ref 78.0–100.0)
PLATELETS: 142 10*3/uL — AB (ref 150–400)
RBC: 3.92 MIL/uL (ref 3.87–5.11)
RDW: 13.1 % (ref 11.5–15.5)
WBC: 8.5 10*3/uL (ref 4.0–10.5)

## 2016-01-11 LAB — AMNISURE RUPTURE OF MEMBRANE (ROM) NOT AT ARMC: AMNISURE: POSITIVE

## 2016-01-11 LAB — GROUP B STREP BY PCR: Group B strep by PCR: NEGATIVE

## 2016-01-11 LAB — TYPE AND SCREEN
ABO/RH(D): A POS
Antibody Screen: NEGATIVE

## 2016-01-11 LAB — ABO/RH: ABO/RH(D): A POS

## 2016-01-11 MED ORDER — OXYCODONE-ACETAMINOPHEN 5-325 MG PO TABS: 2.0000 | ORAL_TABLET | ORAL | Status: DC | PRN

## 2016-01-11 MED ORDER — LIDOCAINE HCL (PF) 1 % IJ SOLN
30.0000 mL | INTRAMUSCULAR | Status: DC | PRN
  Administered 2016-01-12: 30 mL via SUBCUTANEOUS
  Filled 2016-01-11: qty 30

## 2016-01-11 MED ORDER — ACETAMINOPHEN 325 MG PO TABS: 650.0000 mg | ORAL_TABLET | ORAL | Status: DC | PRN

## 2016-01-11 MED ORDER — CITRIC ACID-SODIUM CITRATE 334-500 MG/5ML PO SOLN: 30.0000 mL | ORAL | Status: DC | PRN

## 2016-01-11 MED ORDER — ONDANSETRON HCL 4 MG/2ML IJ SOLN: 4.0000 mg | Freq: Four times a day (QID) | INTRAMUSCULAR | Status: DC | PRN

## 2016-01-11 MED ORDER — OXYCODONE-ACETAMINOPHEN 5-325 MG PO TABS: 1.0000 | ORAL_TABLET | ORAL | Status: DC | PRN

## 2016-01-11 MED ORDER — MISOPROSTOL 25 MCG QUARTER TABLET: 25.0000 ug | ORAL_TABLET | ORAL | Status: DC | PRN

## 2016-01-11 MED ORDER — OXYTOCIN BOLUS FROM INFUSION: 500.0000 mL | INTRAVENOUS | Status: DC

## 2016-01-11 MED ORDER — BUTORPHANOL TARTRATE 1 MG/ML IJ SOLN
2.0000 mg | Freq: Once | INTRAMUSCULAR | Status: DC | PRN
  Filled 2016-01-11: qty 2

## 2016-01-11 MED ORDER — FLEET ENEMA 7-19 GM/118ML RE ENEM: 1.0000 | ENEMA | RECTAL | Status: DC | PRN

## 2016-01-11 MED ORDER — LACTATED RINGERS IV SOLN: INTRAVENOUS | Status: DC

## 2016-01-11 MED ORDER — LACTATED RINGERS IV SOLN
INTRAVENOUS | Status: DC
  Administered 2016-01-11: 16:00:00 via INTRAVENOUS

## 2016-01-11 MED ORDER — OXYTOCIN 10 UNIT/ML IJ SOLN: 2.5000 [IU]/h | INTRAVENOUS | Status: DC

## 2016-01-11 MED ORDER — OXYTOCIN 10 UNIT/ML IJ SOLN
2.5000 [IU]/h | INTRAVENOUS | Status: DC
  Filled 2016-01-11: qty 4

## 2016-01-11 MED ORDER — LACTATED RINGERS IV SOLN: 500.0000 mL | INTRAVENOUS | Status: DC | PRN

## 2016-01-11 MED ORDER — OXYTOCIN BOLUS FROM INFUSION
500.0000 mL | INTRAVENOUS | Status: DC
  Administered 2016-01-12: 500 mL via INTRAVENOUS

## 2016-01-11 MED ORDER — LIDOCAINE HCL (PF) 1 % IJ SOLN: 30.0000 mL | INTRAMUSCULAR | Status: DC | PRN

## 2016-01-11 MED ORDER — TERBUTALINE SULFATE 1 MG/ML IJ SOLN: 0.2500 mg | Freq: Once | INTRAMUSCULAR | Status: DC | PRN

## 2016-01-11 NOTE — MAU Note (Signed)
?  SROM this AM @ 0900;  C/o ucs since 1100 this AM;

## 2016-01-11 NOTE — ED Notes (Signed)
[facility] OB to be called

## 2016-01-11 NOTE — ED Provider Notes (Signed)
CSN: [id number]     Arrival date & time 01/11/16  1151 History   First MD Initiated Contact with Patient 01/11/16 1202     No chief complaint on file.    (Consider location/radiation/quality/duration/timing/severity/associated sxs/prior Treatment) HPI  25 year old G1 who is [redacted] weeks pregnant presents with her water breaking. Patient states that when she woke up her bed was wet this morning. She had a similar episode about one week ago that her OB told her was probably from urination. However this morning she has continued to leak. Worse with standing. Has had a couple episodes of some abdominal cramping but nothing severe. Has never had contractions before so she is not sure if she is contracting. No bleeding. No complications of this pregnancy. [place] She tells me her cervix was 1 cm 1 week ago at her OB's.  Past Medical History  Diagnosis Date  . Medical history non-contributory    Past Surgical History  Procedure Laterality Date  . No past surgeries     No family history on file. Social History  Substance Use Topics  . Smoking status: Never Smoker   . Smokeless tobacco: Not on file  . Alcohol Use: No   OB History    Gravida Para Term Preterm AB TAB SAB Ectopic Multiple Living   1 0        0     Review of Systems  Gastrointestinal: Positive for abdominal pain. Negative for vomiting and abdominal distention.  Genitourinary: Negative for vaginal bleeding.  All other systems reviewed and are negative.     Allergies  Promethazine  Home Medications   Prior to Admission medications   Medication Sig Start Date End Date Taking? Authorizing Provider  metroNIDAZOLE (FLAGYL) 500 MG tablet Take 1 tablet (500 mg total) by mouth 2 (two) times daily. 09/05/15   Kristen N Ward, DO  Prenatal Vit-Fe Fumarate-FA (PRENATAL COMPLETE) 14-0.4 MG TABS Take 1 tablet by mouth daily. 05/24/15   Trixie DredgeEmily West, PA-C   BP 110/83 mmHg  Pulse 111  Temp(Src) 99.2 F (37.3 C) (Oral)   Resp 20  SpO2 100%  LMP 04/22/2015 (Approximate) Physical Exam  Constitutional: She is oriented to person, place, and time. She appears well-developed and well-nourished. No distress.  HENT:  Head: Normocephalic and atraumatic.  Right Ear: External ear normal.  Left Ear: External ear normal.  Nose: Nose normal.  Eyes: Right eye exhibits no discharge. Left eye exhibits no discharge.  Cardiovascular: Normal rate, regular rhythm and normal heart sounds.   Pulmonary/Chest: Effort normal and breath sounds normal.  Abdominal: Soft. She exhibits distension (gravid). There is no tenderness.  Neurological: She is alert and oriented to person, place, and time.  Skin: Skin is warm and dry. She is not diaphoretic.  Nursing note and vitals reviewed.   ED Course  Procedures (including critical care time) Labs Review Labs Reviewed - No data to display  Imaging Review No results found. I have personally reviewed and evaluated these images and lab results as part of my medical decision-making.   EKG Interpretation None      MDM   Final diagnoses:  Abdominal pain in pregnancy    [facility] RN has evaluated patient. FHR tracing shows no concerning findings. Patient feels rare contractions but is having contractions on monitor. Cervix 1 cm per OB RN. Unclear if patient truly ruptured on RN exam, thus [facility] and further testing. Patient is stable,  very low suspicion for imminent delivery.    Pricilla Loveless, MD 01/11/16 1257

## 2016-01-11 NOTE — Progress Notes (Signed)
Called dr Shawnie Ponspratt informed of being called to [place] with g1p0 38.4 weeks, states she woke up wet, thinks her water broke. Gets [place] care in [place], uncomplicated pregnancy. FHR tracing reactive and reassuring, irregular uc's. To transport to [facility] to r/o rupture of membranes. Informed [place] dr.

## 2016-01-11 NOTE — MAU Provider Note (Signed)
Chief Complaint:  Labor Eval   First Provider Initiated Contact with Patient 01/11/16 1426      HPI  Nicole Fuller is a 25 y.o. G1P0 at 2w4dwho presents to maternity admissions reporting Leaking of fluid since 0900.   Small gushes which started about 0900    Feels some contractions since 11am She reports good fetal movement, denies vaginal bleeding, vaginal itching/burning, urinary symptoms, h/a, dizziness, n/v, or fever/chills.    RN Note:  Expand All Collapse All   ?SROM this AM @ 0900; C/o ucs since 1100 this AM;           Past Medical History: Past Medical History  Diagnosis Date  . Medical history non-contributory     Past obstetric history: OB History  Gravida Para Term Preterm AB SAB TAB Ectopic Multiple Living  1 0        0    # Outcome Date GA Lbr Len/2nd Weight Sex Delivery Anes PTL Lv  1 Current               Past Surgical History: Past Surgical History  Procedure Laterality Date  . No past surgeries      Family History: Family History  Problem Relation Age of Onset  . Cancer Mother   . Diabetes Maternal Uncle   . Diabetes Maternal Grandmother     Social History: Social History  Substance Use Topics  . Smoking status: Never Smoker   . Smokeless tobacco: None  . Alcohol Use: No    Allergies:  Allergies  Allergen Reactions  . Promethazine Other (See Comments)    Stomach felt hot, felt sleepy, uncomfortable feelings  . Aspirin     Pt's mother told her she was allergic.  Pt unsure of reaction.  Pt avoids all NSAIDS  . Nsaids     Unknown reaction.  Pts mother told her she was allergic.     Meds:  Prescriptions prior to admission  Medication Sig Dispense Refill Last Dose  . Prenatal Vit-Fe Fumarate-FA (PRENATAL COMPLETE) 14-0.4 MG TABS Take 1 tablet by mouth daily. 60 each 5 01/11/2016 at Unknown time    ROS:  Review of Systems  Constitutional: Negative for fever, chills and fatigue.  Respiratory: Negative for shortness of  breath.   Gastrointestinal: Positive for abdominal pain. Negative for nausea, vomiting, diarrhea and constipation.  Genitourinary: Positive for vaginal discharge. Negative for dysuria and vaginal bleeding.  Musculoskeletal: Negative for back pain.  Neurological: Negative for dizziness.   I have reviewed patient's Past Medical Hx, Surgical Hx, Family Hx, Social Hx, medications and allergies.   Physical Exam  Patient Vitals for the past 24 hrs:  BP Temp Temp src Pulse Resp SpO2  01/11/16 1347 120/81 mmHg 98.9 F (37.2 C) Oral 94 18 -  01/11/16 1344 117/75 mmHg 98.8 F (37.1 C) Oral - 18 -  01/11/16 1251 110/83 mmHg 99.2 F (37.3 C) Oral 111 20 100 %   Constitutional: Well-developed, well-nourished female in no acute distress.  Cardiovascular: normal rate and rhythm Respiratory: normal effort, no distress GI: Abd soft, non-tender, gravid appropriate for gestational age.  MS: Extremities nontender, no edema, normal ROM Neurologic: Alert and oriented x 4.  GU: Neg CVAT.  PELVIC EXAM: Cervix pink, visually closed, without lesion, perineum quite wet, but little pooling in vaginal vault, vaginal walls and external genitalia normal  RN did fern slide, negative I did another which was negative  Dilation: 1 Effacement (%): 70 Station: -1, -2 Presentation: Vertex Exam  by:: soliz rn   FHT:  Baseline 140 , moderate variability, accelerations present, no decelerations Contractions: q 10 mins   Labs: Results for orders placed or performed during the hospital encounter of 01/11/16 (from the past 24 hour(s))  Fern Test     Status: Normal   Collection Time: 01/11/16  2:12 PM  Result Value Ref Range   POCT Fern Test                                                                                       Negative Ferning --/--/A POS (10/12 0516) Results for orders placed or performed during the hospital encounter of 01/11/16 (from the past 24 hour(s))  Fern Test     Status: Normal    Collection Time: 01/11/16  2:12 PM  Result Value Ref Range   POCT Fern Test    Amnisure rupture of membrane (rom)not at [facility]     Status: None   Collection Time: 01/11/16  2:53 PM  Result Value Ref Range   Amnisure ROM POSITIVE     Imaging:  No results found.  MAU Course/MDM: I have Done a speculum exam and fern test which was Negative.  Pooling was negative  Perineum was quite wet. Nitrazine not done.  Given high suspicion for ROM with negative conventional testing, will get an amnisure. >>  Positive   Assessment: 1. Abdominal pain in pregnancy   2.     Premature rupture of membranes at term  Plan: Admit to [facility]thing Suites Routine orders     Medication List    ASK your doctor about these medications        PRENATAL COMPLETE 14-0.4 MG Tabs  Take 1 tablet by mouth daily.        Wynelle BourgeoisMarie Destine Zirkle CNM, MSN Certified Nurse-Midwife 01/11/2016 3:02 PM

## 2016-01-11 NOTE — ED Notes (Signed)
Notified [facility] for transportation to MAU

## 2016-01-11 NOTE — ED Notes (Signed)
Notified [facility] 

## 2016-01-11 NOTE — Progress Notes (Signed)
[facility] to [facility], [facility]

## 2016-01-11 NOTE — H&P (Signed)
Nicole Fuller is a 25 y.o. female presenting for leakage of fluid.  She had a small amount of fluid around 9am this morning and has had ongoing small amounts of leakage throughout the day.  She has had some abdominal cramping as well.  No vaginal bleeding and good fetal movement.  She saw Drs. Nicole Fuller and Nicole Fuller in [place], but she presented to [facility] ED for evaluation and was transferred to MAU.  There, she had no ferning, but Amnisure was positive.   Maternal Medical History:  Reason for admission: Nausea.    OB History    Gravida Para Term Preterm AB TAB SAB Ectopic Multiple Living   1 0        0     Past Medical History  Diagnosis Date  . Medical history non-contributory    Past Surgical History  Procedure Laterality Date  . No past surgeries     Family History: family history includes Cancer in her mother; Diabetes in her maternal grandmother and maternal uncle. Social History:  reports that she has never smoked. She does not have any smokeless tobacco history on file. She reports that she does not drink alcohol or use illicit drugs.   Prenatal Transfer Tool  Maternal Diabetes: No Genetic Screening: Normal Maternal Ultrasounds/Referrals: Normal Fetal Ultrasounds or other Referrals:  None Maternal Substance Abuse:  No Significant Maternal Medications:  None Significant Maternal Lab Results:  Lab values include: Group B Strep negative Other Comments:  none  Review of Systems  Constitutional: Negative for fever and chills.  Respiratory: Negative for cough.   Cardiovascular: Negative for chest pain.  Gastrointestinal: Positive for abdominal pain. Negative for nausea and vomiting.  Genitourinary: Negative for dysuria.  Musculoskeletal: Negative for back pain.  Skin: Negative for rash.  Neurological: Negative for dizziness and headaches.    Dilation: 1 Effacement (%): 80 Station: -2 Exam by:: Dr. Marlowe Fuller Blood pressure 117/80, pulse 90, temperature 98.1 F (36.7  C), temperature source Oral, resp. rate 18, last menstrual period 04/22/2015, SpO2 100 %. Exam Physical Exam  Constitutional: She is oriented to person, place, and time. She appears well-developed and well-nourished. No distress.  HENT:  Head: Normocephalic.  Eyes: Conjunctivae are normal.  Neck: Neck supple.  Cardiovascular: Normal rate, regular rhythm and normal heart sounds.   No murmur heard. Respiratory: Effort normal and breath sounds normal. No respiratory distress.  GI:  Gravid, nontender  Musculoskeletal: She exhibits no edema.  Neurological: She is alert and oriented to person, place, and time.  Skin: Skin is warm and dry. No rash noted.  Psychiatric: She has a normal mood and affect.    FHTs: baseline 135bpm, moderate variability, + accels, - decels  Prenatal labs: ABO, Rh: --/--/A POS (04/18 1710) Antibody: NEG (04/18 1710) Rubella: Non-Immune RPR: Non Reactive (08/29 1826)  HBsAg: not noted in outside records  HIV: Non Reactive (08/29 1826)  GBS: Neg  Assessment/Plan: Ms. Nicole Fuller is a 24yo G1 at 38+4 (by 13wk [place] c/w LMP) who presents with PROM.  PROM H/o depression A+/Rubella NI/GBS- Boy/Breast/Condoms   Admit to BS for IOL for PROM. SVE 1/70/-2 on presentation with irregular contractions and uterine irritability - will place Foley balloon only at this time. Anticipate SVD.  Nicole Fuller 01/11/2016, 6:22 PM   OB fellow attestation: I have seen and examined this patient; I agree with above documentation in the resident's note.   Nicole Fuller is a 25 y.o. G1P0 here for SROM/PROM  PE: BP 111/61  mmHg  Pulse 95  Temp(Src) 99.1 F (37.3 C) (Oral)  Resp 16  Ht 5' 9"  (1.753 m)  Wt 176 lb (79.833 kg)  BMI 25.98 kg/m2  SpO2 100%  LMP 04/22/2015 (Approximate) Gen: calm comfortable, NAD Resp: normal effort, no distress Abd: gravid  ROS, labs, PMH reviewed  Plan: Admit to LD Labor:Augment with FB/Cytotec FWB: Cat I ID: GBS PCR negative.  Need MMR pp H/o Depression: plan for mood check in 2 weeks  Caren Macadam, MD  Family Medicine, OB Fellow 01/11/2016, 8:37 PM   Attending Physician: Darron Doom MD

## 2016-01-12 ENCOUNTER — Encounter (HOSPITAL_COMMUNITY): Payer: Self-pay | Admitting: *Deleted

## 2016-01-12 DIAGNOSIS — O4202 Full-term premature rupture of membranes, onset of labor within 24 hours of rupture: Secondary | ICD-10-CM

## 2016-01-12 DIAGNOSIS — O99344 Other mental disorders complicating childbirth: Secondary | ICD-10-CM

## 2016-01-12 DIAGNOSIS — Z3A38 38 weeks gestation of pregnancy: Secondary | ICD-10-CM

## 2016-01-12 LAB — RPR: RPR Ser Ql: NONREACTIVE

## 2016-01-12 MED ORDER — IBUPROFEN 600 MG PO TABS
ORAL_TABLET | ORAL | Status: AC
  Administered 2016-01-12: 600 mg
  Filled 2016-01-12: qty 1

## 2016-01-12 MED ORDER — ONDANSETRON HCL 4 MG/2ML IJ SOLN: 4.0000 mg | INTRAMUSCULAR | Status: DC | PRN

## 2016-01-12 MED ORDER — MEASLES, MUMPS & RUBELLA VAC ~~LOC~~ INJ
0.5000 mL | INJECTION | Freq: Once | SUBCUTANEOUS | Status: DC
  Filled 2016-01-12: qty 0.5

## 2016-01-12 MED ORDER — ACETAMINOPHEN 325 MG PO TABS
650.0000 mg | ORAL_TABLET | ORAL | Status: DC | PRN
  Administered 2016-01-12: 650 mg via ORAL
  Filled 2016-01-12: qty 2

## 2016-01-12 MED ORDER — SIMETHICONE 80 MG PO CHEW: 80.0000 mg | CHEWABLE_TABLET | ORAL | Status: DC | PRN

## 2016-01-12 MED ORDER — SENNOSIDES-DOCUSATE SODIUM 8.6-50 MG PO TABS
2.0000 | ORAL_TABLET | ORAL | Status: DC
  Administered 2016-01-12: 2 via ORAL
  Filled 2016-01-12: qty 2

## 2016-01-12 MED ORDER — IBUPROFEN 600 MG PO TABS: 600.0000 mg | ORAL_TABLET | Freq: Four times a day (QID) | ORAL | Status: DC

## 2016-01-12 MED ORDER — ONDANSETRON HCL 4 MG PO TABS: 4.0000 mg | ORAL_TABLET | ORAL | Status: DC | PRN

## 2016-01-12 MED ORDER — TETANUS-DIPHTH-ACELL PERTUSSIS 5-2.5-18.5 LF-MCG/0.5 IM SUSP: 0.5000 mL | Freq: Once | INTRAMUSCULAR | Status: DC

## 2016-01-12 MED ORDER — DIBUCAINE 1 % RE OINT
1.0000 "application " | TOPICAL_OINTMENT | RECTAL | Status: DC | PRN
  Filled 2016-01-12: qty 28

## 2016-01-12 MED ORDER — ZOLPIDEM TARTRATE 5 MG PO TABS: 5.0000 mg | ORAL_TABLET | Freq: Every evening | ORAL | Status: DC | PRN

## 2016-01-12 MED ORDER — WITCH HAZEL-GLYCERIN EX PADS: 1.0000 "application " | MEDICATED_PAD | CUTANEOUS | Status: DC | PRN

## 2016-01-12 MED ORDER — COCONUT OIL OIL
1.0000 "application " | TOPICAL_OIL | Status: DC | PRN
  Administered 2016-01-13: 1 via TOPICAL
  Filled 2016-01-12 (×2): qty 120

## 2016-01-12 MED ORDER — BENZOCAINE-MENTHOL 20-0.5 % EX AERO
1.0000 "application " | INHALATION_SPRAY | CUTANEOUS | Status: DC | PRN
  Administered 2016-01-12: 1 via TOPICAL
  Filled 2016-01-12 (×2): qty 56

## 2016-01-12 MED ORDER — PRENATAL MULTIVITAMIN CH
1.0000 | ORAL_TABLET | Freq: Every day | ORAL | Status: DC
  Administered 2016-01-12 – 2016-01-13 (×2): 1 via ORAL
  Filled 2016-01-12: qty 1

## 2016-01-12 MED ORDER — DIPHENHYDRAMINE HCL 25 MG PO CAPS: 25.0000 mg | ORAL_CAPSULE | Freq: Four times a day (QID) | ORAL | Status: DC | PRN

## 2016-01-12 NOTE — Lactation Note (Signed)
This note was copied from a baby's chart. Lactation Consultation Note  Patient Name: [name] ZOXWR'UToday's Date: 01/12/2016 Reason for consult: Initial assessment   Initial consult with first time mom of 7 hour old infant. Infant with 3 BF for 10-20 minutes. Mom reports infant comes on and off the breast. She had him latched to right breast in football hold when I entered room. She was able to tell when he slipped off the breast and readjusted him as needed. Assisted her with aligning the infant and keeping him pulled closely into breast. She was giving breathing space, enc her to massage/compress breast with feeding. Infant was noted to have flanged lips and rhythmic sucks and intermittent swallows.   Mom with compressible breasts/areola area and everted nipples. + breast changes with pregnancy. Colostrum easily expressed on the left breast while showing mom how to hand express, spoon feeding discussed and spoon at bedside. Reviewed NL NB Feeding behavior and stomach size, milk coming to volume and NB Nutritional needs. Enc mom to BF 8-12 x in 24 hours at first feeding cues. [facility]   Gave mom BF Resources Handout and LC Brochure. Informed of IP/OP Services, BF Support Groups and LC Phone #. Feeding log is being maintained. Enc mom to call with questions/concerns prn.   Maternal Data Formula Feeding for Exclusion: No Has patient been taught Hand Expression?: Yes Does the patient have breastfeeding experience prior to this delivery?: No  Feeding Feeding Type: Breast Fed Length of feed: 10 min  LATCH Score/Interventions Latch: Repeated attempts needed to sustain latch, nipple held in mouth throughout feeding, stimulation needed to elicit sucking reflex. Intervention(s): Adjust position;Assist with latch;Breast massage;Breast compression  Audible Swallowing: A few with stimulation Intervention(s): Hand expression;Alternate breast massage  Type of Nipple:  Everted at rest and after stimulation  Comfort (Breast/Nipple): Soft / non-tender     Hold (Positioning): No assistance needed to correctly position infant at breast.  LATCH Score: 8  Lactation Tools Discussed/Used WIC Program: Yes   Consult Status Consult Status: Follow-up Date: 01/13/16 Follow-up type: In-patient    Nicole Fuller 01/12/2016, 10:56 AM

## 2016-01-13 MED ORDER — MEASLES, MUMPS & RUBELLA VAC ~~LOC~~ INJ
0.5000 mL | INJECTION | Freq: Once | SUBCUTANEOUS | Status: AC
  Administered 2016-01-13: 0.5 mL via SUBCUTANEOUS
  Filled 2016-01-13 (×2): qty 0.5

## 2016-01-13 MED ORDER — ACETAMINOPHEN 325 MG PO TABS: 650.0000 mg | ORAL_TABLET | ORAL | Status: DC | PRN
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
## 2016-01-13 NOTE — Lactation Note (Signed)
This note was copied from a baby's chart. Lactation Consultation Note  Mom states feedings are going well.  Baby has been doing some cluster feedings.  Nipples sore but intact.  Mom using expressed milk to rub into nipples after feeding.  RN will also give her some coconut oil to use in addition.  Reviewed basics and encouraged to call for concerns/assist.  Patient Name: Nicole Fuller ChessmanLaquisha Whitaker ZOXWR'UToday's Date: 01/13/2016     Maternal Data    Feeding    LATCH Score/Interventions                      Lactation Tools Discussed/Used     Consult Status      Huston FoleyMOULDEN, Harsimran Westman S 01/13/2016, 3:28 PM

## 2016-01-13 NOTE — Discharge Instructions (Signed)

## 2016-01-13 NOTE — Progress Notes (Signed)
Post Partum Day 1 Subjective:  Nicole Fuller is a 25 y.o. G1P1001 1562w5d s/p NSVD.  No acute events overnight.  Pt denies problems with ambulating, voiding or po intake.  She denies nausea or vomiting.  Pain is well controlled.  She has had flatus.  Lochia Minimal.  Plan for birth control is condoms.  Method of Feeding: breast  Objective: Blood pressure 107/79, pulse 85, temperature 97.7 F (36.5 C), temperature source Axillary, resp. rate 18, height 5\' 9"  (1.753 m), weight 176 lb (79.833 kg), last menstrual period 04/22/2015, SpO2 99 %, unknown if currently breastfeeding.  Physical Exam:  General: alert, cooperative and no distress Lochia:normal flow Chest: normal WOB Heart: Regular rate Abdomen: +BS, soft, mild TTP (appropriate) Uterine Fundus: firm DVT Evaluation: No evidence of DVT seen on physical exam. Extremities: no edema   Recent Labs  01/11/16 1710  HGB 13.4  HCT 36.8    Assessment/Plan:  ASSESSMENT: Nicole Fuller is a 25 y.o. G1P1001 6562w5d s/p NSVD  Plan for discharge tomorrow and Breastfeeding Continue routine PP care Breastfeeding support PRN   LOS: 2 days   Federico FlakeKimberly Niles Excell Neyland 01/13/2016, 4:07 PM

## 2016-01-13 NOTE — Discharge Summary (Signed)
OB Discharge Summary     Patient Name: Nicole Fuller DOB: [birth year] MRN: [id number]  Date of admission: 01/11/2016 Delivering MD: Hyman Bible DODD   Date of discharge: 01/13/2016  Admitting diagnosis: Abdominal pain in pregnancy [O99.89, R10.9] Intrauterine pregnancy: [redacted]w[redacted]d    Secondary diagnosis:  SVD Rubella non immune status in pregnancy  Additional problems: none     Discharge diagnosis: Term Pregnancy Delivered                                                                                                Post partum procedures:MMR  Augmentation: Foley balloon and Cytotec  Complications: None  Hospital course:  Induction of Labor With Vaginal Delivery   25y.o. yo G1P1001 at 358w5das admitted to the hospital 01/11/2016 for induction of labor after arriving with PROM.  Indication for induction: PROM at term.  Patient had an uncomplicated labor course as follows: Foley balloon placed upon arrival.  Cytotec added later as contractions had spaced out.  Patient progressed to complete and had a NSVD. Membrane Rupture Time/Date: 9:00 AM ,01/11/2016   Intrapartum Procedures: Episiotomy: None [1]                                         Lacerations:  Sulcus [9]  Patient had delivery of a Viable infant.  Information for the patient's newborn:  WhDaniella, Dewberry0[[id number]]Delivery Method: Vaginal, Spontaneous Delivery (Filed from Delivery Summary)   01/12/2016  Details of delivery can be found in separate delivery note.  Patient had a routine postpartum course. Patient is discharged home 01/13/2016.   Physical exam  Filed Vitals:   01/12/16 0633 01/12/16 1015 01/12/16 1814 01/13/16 0613  BP: 103/54 122/71 105/53 107/79  Pulse: 104 98 75 85  Temp: 98.7 F (37.1 C) 98.2 F (36.8 C) 98.2 F (36.8 C) 97.7 F (36.5 C)  TempSrc:  Oral Oral Axillary  Resp:  18 17 18   Height:      Weight:      SpO2:    99%   General: alert, cooperative and no distress Lochia:  appropriate Uterine Fundus: firm Incision: N/A DVT Evaluation: No evidence of DVT seen on physical exam. Labs: Lab Results  Component Value Date   WBC 8.5 01/11/2016   HGB 13.4 01/11/2016   HCT 36.8 01/11/2016   MCV 93.9 01/11/2016   PLT 142* 01/11/2016   CMP Latest Ref Rng 06/23/2015  Glucose 65 - 99 mg/dL 83  BUN 6 - 20 mg/dL 10  Creatinine 0.44 - 1.00 mg/dL 0.65  Sodium 135 - 145 mmol/L 133(L)  Potassium 3.5 - 5.1 mmol/L 3.8  Chloride 101 - 111 mmol/L 102  CO2 22 - 32 mmol/L 23  Calcium 8.9 - 10.3 mg/dL 9.2  Total Protein 6.5 - 8.1 g/dL 6.9  Total Bilirubin 0.3 - 1.2 mg/dL 0.4  Alkaline Phos 38 - 126 U/L 41  AST 15 - 41 U/L 51(H)  ALT 14 - 54 U/L 67(H)  Discharge instruction: per After Visit Summary and "Baby and Me Booklet".  After visit meds:    Medication List    TAKE these medications        acetaminophen 325 MG tablet  Commonly known as:  TYLENOL  Take 2 tablets (650 mg total) by mouth every 4 (four) hours as needed (for pain scale < 4).     PRENATAL COMPLETE 14-0.4 MG Tabs  Take 1 tablet by mouth daily.        Diet: routine diet  Activity: Advance as tolerated. Pelvic rest for 6 weeks.   Outpatient follow up:6 weeks Follow up Appt:No future appointments. Follow up Visit:No Follow-up on file.  Postpartum contraception: Condoms  Newborn Data: Live born female  Birth Weight: 7 lb 0.7 oz (3195 g) APGAR: 9, 9  Baby Feeding: Breast Disposition:home with mother   01/13/2016 [name], MD

## 2016-01-14 ENCOUNTER — Ambulatory Visit: Payer: Self-pay

## 2016-01-14 NOTE — Lactation Note (Signed)
This note was copied from a baby's chart. Lactation Consultation Note  Patient Name: Boy Halford ChessmanLaquisha Whitaker WUJWJ'XToday's Date: 01/14/2016 Reason for consult: Follow-up assessment;Infant weight loss Mom is latching baby independently, baby demonstrating good suckling bursts with swallows at the beginning of the feeding but does get sleepy after about 5 minutes, stimulation needed to keep baby active at breast. Baby did nurse with intermittent swallows for 15 minutes, then re-latched again for an additional 10 minutes. Demonstrated finger feeding with curved tipped syringe to parents and MGM, baby took 5 ml then re-latched to the right breast.  Mom has started to post pump after feeding, received 5 ml of colostrum with last pumping. Advised Mom baby should be at the breast 8-12 times and with feeding ques in 24 hours. Keep baby nursing for 15-30 minutes both breasts some feedings, post pump for 15 minutes and give baby back any amount of EBM Mom receives. Mom does not want to start formula at this time. Call for assist as needed.   Maternal Data    Feeding Feeding Type: Breast Fed Length of feed: 20 min  LATCH Score/Interventions Latch: Grasps breast easily, tongue down, lips flanged, rhythmical sucking.  Audible Swallowing: A few with stimulation  Type of Nipple: Everted at rest and after stimulation  Comfort (Breast/Nipple): Soft / non-tender     Hold (Positioning): No assistance needed to correctly position infant at breast.  LATCH Score: 9  Lactation Tools Discussed/Used Tools: Pump Breast pump type: Double-Electric Breast Pump Pump Review: Setup, frequency, and cleaning;Milk Storage Initiated by:: Earley FavorK. Eliel Dudding RN Date initiated:: 01/14/16   Consult Status Consult Status: Follow-up Date: 01/15/16 Follow-up type: In-patient    Alfred LevinsGranger, Ilhan Madan Ann 01/14/2016, 3:49 PM

## 2016-01-14 NOTE — Lactation Note (Signed)
This note was copied from a baby's chart. Lactation Consultation Note Baby had 10% weight loss at 51 hrs old. Had 10 voids and 9 stools. Mom has easy flow of colostrum. Mom states her breast are feeling heavier. Encouraged to wear bra. Discussed milk coming in, supply and demand. Adjusted baby to face mom more in positioning baby towards mom. LC feels BF going well. Mom has coconut oil for soreness.  Patient Name: Nicole Halford ChessmanLaquisha Fuller OZHYQ'MToday's Date: 01/14/2016 Reason for consult: Follow-up assessment   Maternal Data    Feeding Feeding Type: Breast Fed Length of feed: 15 min  LATCH Score/Interventions Latch: Grasps breast easily, tongue down, lips flanged, rhythmical sucking. Intervention(s): Adjust position;Assist with latch  Audible Swallowing: Spontaneous and intermittent Intervention(s): Hand expression;Skin to skin  Type of Nipple: Everted at rest and after stimulation  Comfort (Breast/Nipple): Filling, red/small blisters or bruises, mild/mod discomfort  Problem noted: Mild/Moderate discomfort Interventions (Mild/moderate discomfort): Hand massage;Hand expression  Hold (Positioning): Assistance needed to correctly position infant at breast and maintain latch. Intervention(s): Position options;Support Pillows  LATCH Score: 8  Lactation Tools Discussed/Used     Consult Status Consult Status: Follow-up Date: 01/15/16 Follow-up type: In-patient    Charyl DancerCARVER, Kiya Eno G 01/14/2016, 6:44 AM

## 2016-01-14 NOTE — Lactation Note (Signed)
This note was copied from a baby's chart. Lactation Consultation Note  Patient Name: Boy Halford ChessmanLaquisha Whitaker ZOXWR'UToday's Date: 01/14/2016  RN and Mom had baby on the breast. Baby demonstrating few good suckling bursts, swallowing motions noted. Discussed weight loss with Mom baby now at 11% weight loss. Encouraged supplementing with EBM after feedings, Mom reports breasts are getting heavier.  RN setting up DEBP. Mom to post pump now that baby asleep after BF for 20 minutes. Will come back and demonstrate how to give supplement via curved tipped syringe or with 5 fr feeding tube, Mom does not want to use bottles at this point.    Maternal Data    Feeding Feeding Type: Breast Fed Length of feed: 20 min  LATCH Score/Interventions Latch: Grasps breast easily, tongue down, lips flanged, rhythmical sucking. Intervention(s): Adjust position;Assist with latch;Breast massage;Breast compression  Audible Swallowing: A few with stimulation Intervention(s): Skin to skin;Hand expression  Type of Nipple: Everted at rest and after stimulation  Comfort (Breast/Nipple): Soft / non-tender  Interventions (Mild/moderate discomfort): Hand expression;Post-pump  Hold (Positioning): Assistance needed to correctly position infant at breast and maintain latch. Intervention(s): Breastfeeding basics reviewed;Support Pillows;Position options;Skin to skin  LATCH Score: 8  Lactation Tools Discussed/Used Pump Review: Setup, frequency, and cleaning;Milk Storage Initiated by:: Earley FavorK. Miley Lindon RN Date initiated:: 01/14/16   Consult Status      Alfred LevinsGranger, Hanae Waiters Ann 01/14/2016, 2:06 PM

## 2016-01-15 ENCOUNTER — Other Ambulatory Visit: Payer: Self-pay | Admitting: Obstetrics & Gynecology

## 2016-01-15 ENCOUNTER — Ambulatory Visit: Payer: Self-pay

## 2016-01-15 NOTE — Lactation Note (Signed)
This note was copied from a baby's chart. Lactation Consultation Note  Upon entering mother getting ready to syringe feed breastmilk to hungry baby. Asked mother why she was not going to breastfeed him and she replied "because I did not want breastmilk to go bad". Reviewed that breastmilk can be at room temp for 6-8 hours. Recommend breastfeeding first and then giving pumped breastmilk afterwards. Baby crying.  Noted short lingual anterior frenulum.  Peds MD aware. Mother latched baby in football hold. Sucks and swallows observed.  LS9. Due to weight loss mother has been pumping and syringe feeding pumped breastmilk in addition to breastfeeding. She recently pumped 40ml.  Due to larger volume mother can decide if she wants to supplement w/ slow flow bottle. Suggest continuing to supplement with pumped breastmilk until baby returns to birth weight. Reviewed engorgement care and monitoring voids/stools.    Patient Name: Nicole Halford ChessmanLaquisha Fuller GEXBM'WToday's Date: 01/15/2016 Reason for consult: Follow-up assessment   Maternal Data    Feeding Feeding Type: Breast Fed Length of feed: 15 min  LATCH Score/Interventions Latch: Grasps breast easily, tongue down, lips flanged, rhythmical sucking.  Audible Swallowing: Spontaneous and intermittent  Type of Nipple: Everted at rest and after stimulation  Comfort (Breast/Nipple): Filling, red/small blisters or bruises, mild/mod discomfort  Problem noted: Filling;Mild/Moderate discomfort Interventions (Mild/moderate discomfort): Post-pump  Hold (Positioning): No assistance needed to correctly position infant at breast.  LATCH Score: 9  Lactation Tools Discussed/Used     Consult Status Consult Status: Complete    Hardie PulleyBerkelhammer, Ruth Boschen 01/15/2016, 9:51 AM

## 2016-01-16 ENCOUNTER — Inpatient Hospital Stay (HOSPITAL_COMMUNITY)
Admission: AD | Admit: 2016-01-16 | Discharge: 2016-01-16 | Disposition: A | Discharge status: Home / Self Care | Payer: Medicaid Other | Source: Ambulatory Visit | Attending: Obstetrics & Gynecology | Admitting: Obstetrics & Gynecology

## 2016-01-16 DIAGNOSIS — N939 Abnormal uterine and vaginal bleeding, unspecified: Secondary | ICD-10-CM | POA: Diagnosis present

## 2016-01-16 LAB — CBC WITH DIFFERENTIAL/PLATELET
Basophils Absolute: 0 10*3/uL (ref 0.0–0.1)
Basophils Relative: 0 %
Eosinophils Absolute: 0.2 10*3/uL (ref 0.0–0.7)
Eosinophils Relative: 2 %
HEMATOCRIT: 33.7 % — AB (ref 36.0–46.0)
HEMOGLOBIN: 11.7 g/dL — AB (ref 12.0–15.0)
LYMPHS ABS: 2.1 10*3/uL (ref 0.7–4.0)
LYMPHS PCT: 23 %
MCH: 32.7 pg (ref 26.0–34.0)
MCHC: 34.7 g/dL (ref 30.0–36.0)
MCV: 94.1 fL (ref 78.0–100.0)
Monocytes Absolute: 0.4 10*3/uL (ref 0.1–1.0)
Monocytes Relative: 5 %
NEUTROS ABS: 6.3 10*3/uL (ref 1.7–7.7)
NEUTROS PCT: 70 %
Platelets: 242 10*3/uL (ref 150–400)
RBC: 3.58 MIL/uL — AB (ref 3.87–5.11)
RDW: 13 % (ref 11.5–15.5)
WBC: 9 10*3/uL (ref 4.0–10.5)

## 2016-01-16 NOTE — Discharge Instructions (Signed)
Postpartum Hemorrhage °Postpartum hemorrhage is excessive blood loss after childbirth. Some blood loss is normal after delivering a baby. However, postpartum hemorrhage is a potentially serious condition.  °CAUSES  °· A loss of muscle tone in the uterus after childbirth. °· Failure to deliver all of the placenta. °· Wounds in the birth canal caused by delivery of the fetus. °· A maternal bleeding disorder that prevents blood clotting (rare). °RISK FACTORS °You are at greater risk for postpartum hemorrhage if you: °· Have a history of postpartum hemorrhage. °· Have delivered more than one baby. °· Had preeclampsia or eclampsia. °· Had problems with the placenta. °· Had complications during your labor or delivery. °· Are obese. °· Are Asian or Hispanic. °SIGNS AND SYMPTOMS  °Vaginal bleeding after delivery is normal and should be expected. Bleeding (lochia) will occur for several days after childbirth. This can be expected with normal vaginal deliveries and cesarean deliveries.  °You are bleeding too much after your delivery if you are: °· Passing large clots or pieces of tissue. This may be small pieces of placenta left after delivery.   °· Soaking more than one sanitary pad per hour for several hours.   °· Having heavy, bright-red bleeding that occurs 4 days or more after delivery.   °· Having a discharge that has a bad smell or if you begin to run an unexplained fever.   °· Having times of lightheadedness or fainting, feeling short of breath, or having your heart beat fast with very little activity.   °DIAGNOSIS  °A diagnosis is based on your symptoms and a physical exam of your perineum, vagina, cervix, and uterus. Diagnostic tests may include: °· Blood pressure and pulse. °· Blood tests. °· Blood clotting tests. °· Ultrasonography. °TREATMENT °· Treatment is based on the severity of bleeding and may include: °¨ Uterine massage. °¨ Medicines. °¨ Blood transfusions. °· Sometimes bleeding occurs if portions of the  placenta are left behind in the uterus after delivery. If this happens, often a curettage or scraping of the inside of the uterus must be done. This usually stops the bleeding. If this treatment does not stop the bleeding, surgery (hysterectomy) may have to be performed to remove the uterus. °· If bleeding is due to clotting or bleeding problems that are not related to the pregnancy, other treatments may be needed.   °HOME CARE INSTRUCTIONS  °· Limit your activity as directed by your health care provider. Your health care provider may order bed rest (getting up to the bathroom only) or may allow you to continue light activity.   °· Keep track of the number of pads you use each day and how soaked (saturated) they are. Write this number down.   °· Do not use tampons. Do not douche or have sexual intercourse until approved by your health care provider.   °· Drink enough fluids to keep your urine clear or pale yellow.   °· Get proper amounts of rest.   °· Eat foods that are rich in iron, such as spinach, red meat, and legumes.   °SEEK IMMEDIATE MEDICAL CARE IF: °· You experience severe cramps in your stomach, back, or belly (abdomen).   °· You have a fever.   °· You pass large clots or tissue. Save any tissue for your health care provider to look at.   °· Your bleeding increases. °· You become weak or lightheaded, or you pass out.   °· Your sanitary pad count per hour is increasing. °MAKE SURE YOU: °· Understand these instructions. °· Will watch your condition. °· Will get help right away if   you are not doing well or get worse. °  °This information is not intended to replace advice given to you by your health care provider. Make sure you discuss any questions you have with your health care provider. °  °Document Released: 12/02/2003 Document Revised: 09/16/2013 Document Reviewed: 02/27/2013 °Elsevier Interactive Patient Education ©2016 [employer] ° °

## 2016-01-16 NOTE — MAU Note (Signed)
Pt delivered SVD on 01/12/2016. Noticed a "fleshy" discharge today and vaginal bleeding that is heavier than when she left. Also has an odor to it. Denies pain.

## 2016-01-16 NOTE — MAU Provider Note (Signed)
History     CSN: [id number]  Arrival date and time: 01/16/16 4098   First Provider Initiated Contact with Patient 01/16/16 2011      Chief Complaint  Patient presents with  . Vaginal Discharge   Vaginal Discharge The patient's primary symptoms include vaginal discharge. This is a new problem. The current episode started today (around 1830 ). The problem occurs intermittently. The problem has been resolved. The patient is experiencing no pain. She is not pregnant (postpartum ). Pertinent negatives include no abdominal pain, chills, constipation, diarrhea, dysuria, fever, frequency, nausea, urgency or vomiting. The vaginal bleeding is lighter than menses. She has been passing clots (passed one clot that she thought looked more "flesh" clolored than a normal clot. Bleeding before and after passing the clot has been "normal". ). Nothing aggravates the symptoms. She has tried nothing for the symptoms.    Past Medical History  Diagnosis Date  . Medical history non-contributory     Past Surgical History  Procedure Laterality Date  . No past surgeries      Family History  Problem Relation Age of Onset  . Cancer Mother   . Diabetes Maternal Uncle   . Diabetes Maternal Grandmother     Social History  Substance Use Topics  . Smoking status: Never Smoker   . Smokeless tobacco: Not on file  . Alcohol Use: No    Allergies:  Allergies  Allergen Reactions  . Promethazine Other (See Comments)    Reaction:  Weakness   . Aspirin Other (See Comments)    Reaction:  Unknown   . Nsaids Other (See Comments)    Reaction:  Unknown     Prescriptions prior to admission  Medication Sig Dispense Refill Last Dose  . acetaminophen (TYLENOL) 325 MG tablet Take 2 tablets (650 mg total) by mouth every 4 (four) hours as needed (for pain scale < 4). 50 tablet 0 01/16/2016 at 1630  . ibuprofen (ADVIL,MOTRIN) 200 MG tablet Take 600 mg by mouth every 6 (six) hours as needed for headache or mild pain.    01/15/2016 at 1400  . Prenatal Vit-Fe Fumarate-FA (PRENATAL COMPLETE) 14-0.4 MG TABS Take 1 tablet by mouth daily. 60 each 5 Past Week at Unknown time    Review of Systems  Constitutional: Negative for fever and chills.  Gastrointestinal: Negative for nausea, vomiting, abdominal pain, diarrhea and constipation.  Genitourinary: Positive for vaginal discharge. Negative for dysuria, urgency and frequency.   Physical Exam   Blood pressure 121/78, pulse 100, temperature 98 F (36.7 C), temperature source Oral, resp. rate 20, height  (1.702 m), weight 75.297 kg (166 lb), last menstrual period 04/22/2015, unknown if currently breastfeeding.  Physical Exam  Nursing note and vitals reviewed. Constitutional: She is oriented to person, place, and time. She appears well-developed and well-nourished. No distress.  HENT:  Head: Normocephalic.  Cardiovascular: Normal rate.   Respiratory: Effort normal.  GI: Soft. There is no tenderness. There is no rebound.  Genitourinary:  Fundus: firm U-3 Bleeding is scant at this time.  Examined the "tissue" that patient brought in. Appears to be clot and possible membrane attached. Given that bleeding is scant, and patient is afebrile, ok for continued expectant management.   Neurological: She is alert and oriented to person, place, and time.  Skin: Skin is warm and dry.  Psychiatric: She has a normal mood and affect.   Results for orders placed or performed during the hospital encounter of 01/16/16 (from the past 24 hour(s))  CBC with Differential/Platelet     Status: Abnormal   Collection Time: 01/16/16  8:27 PM  Result Value Ref Range   WBC 9.0 4.0 - 10.5 K/uL   RBC 3.58 (L) 3.87 - 5.11 MIL/uL   Hemoglobin 11.7 (L) 12.0 - 15.0 g/dL   HCT 40.933.7 (L) 81.136.0 - 91.446.0 %   MCV 94.1 78.0 - 100.0 fL   MCH 32.7 26.0 - 34.0 pg   MCHC 34.7 30.0 - 36.0 g/dL   RDW 78.213.0 95.611.5 - 21.315.5 %   Platelets 242 150 - 400 K/uL   Neutrophils Relative % 70 %   Neutro Abs 6.3  1.7 - 7.7 K/uL   Lymphocytes Relative 23 %   Lymphs Abs 2.1 0.7 - 4.0 K/uL   Monocytes Relative 5 %   Monocytes Absolute 0.4 0.1 - 1.0 K/uL   Eosinophils Relative 2 %   Eosinophils Absolute 0.2 0.0 - 0.7 K/uL   Basophils Relative 0 %   Basophils Absolute 0.0 0.0 - 0.1 K/uL    MAU Course  Procedures  MDM   Assessment and Plan   1. Abnormal uterine bleeding, postpartum    DC home Comfort measures reviewed  Bleeding precautions RX: none  Return to MAU as needed FU with OB as planned  Follow-up Information    [facility]   Specialty:  Obstetrics and Gynecology   Why:  As scheduled   Contact information:   [address] [place] [id number] [phone]        Tawnya CrookHogan, Kilea Mccarey Donovan 01/16/2016, 8:19 PM

## 2016-05-15 IMAGING — US US OB TRANSVAGINAL
1 series · 14 of 28 positions shown · non-contrast
Comparison: None.

CLINICAL DATA: Positive pregnancy test, pelvic pain. Symptoms for 4
days.

EXAM:
OBSTETRIC <14 WK US AND TRANSVAGINAL OB US
TECHNIQUE: Both transabdominal and transvaginal ultrasound examinations were
performed for complete evaluation of the gestation as well as the
maternal uterus, adnexal regions, and pelvic cul-de-sac.
Transvaginal technique was performed to assess early pregnancy.

[Series 1: us ob transvaginal · 0.20mm/px · 14 of 62 slices shown]
[im 3/62]
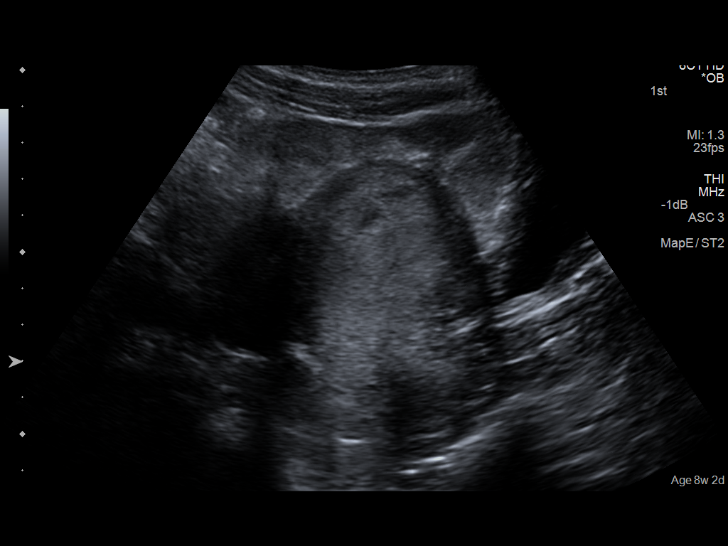
[im 7/62]
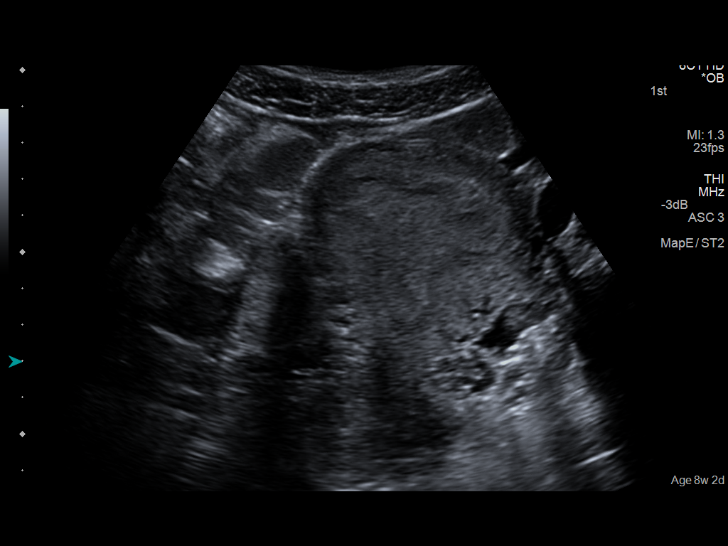
[im 12/62]
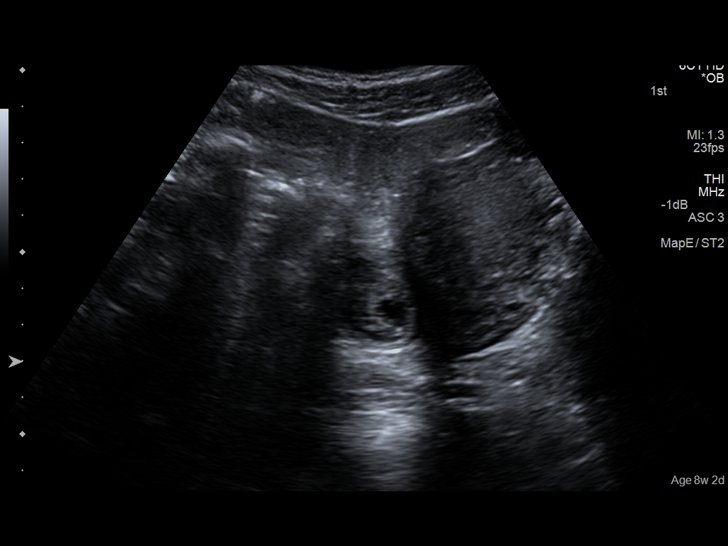
[im 16/62]
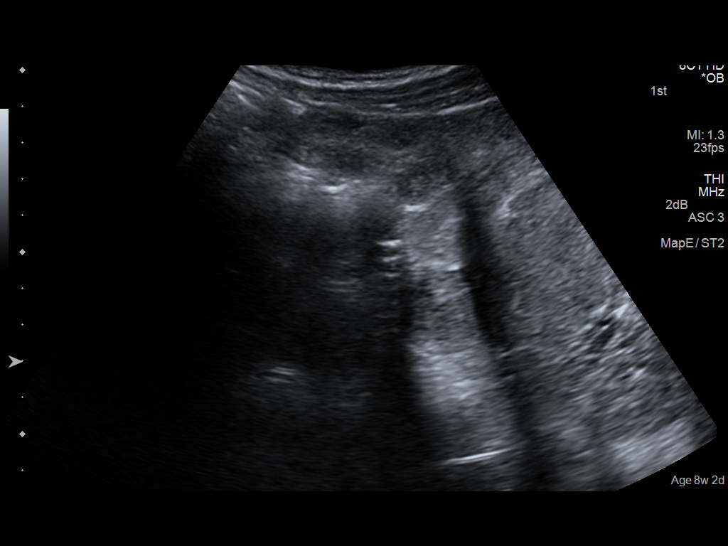
[im 21/62]
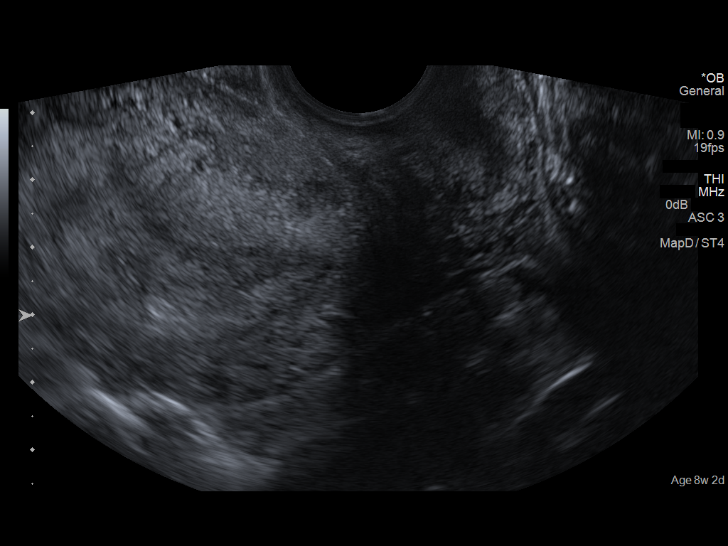
[im 25/62]
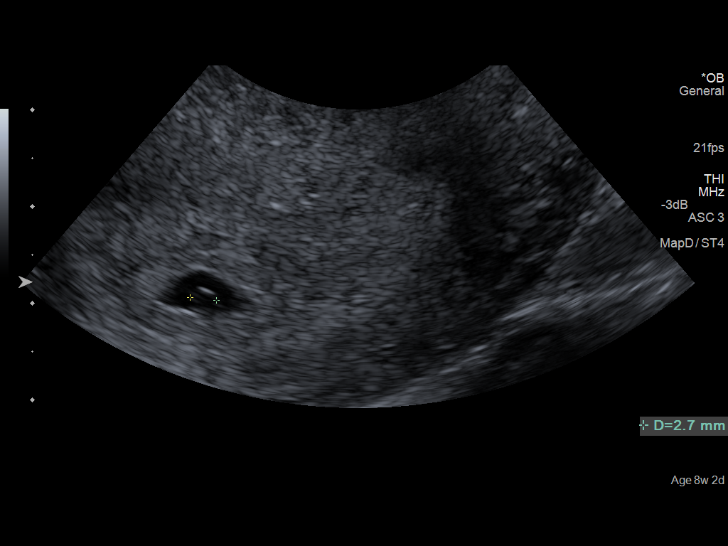
[im 30/62]
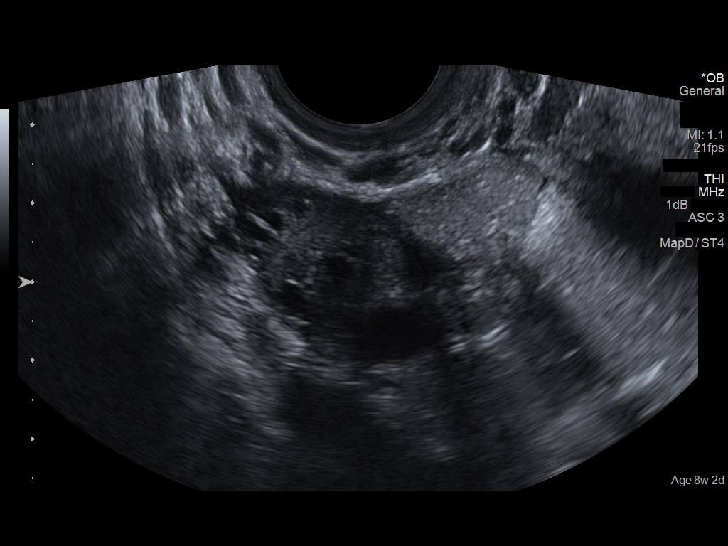
[im 34/62]
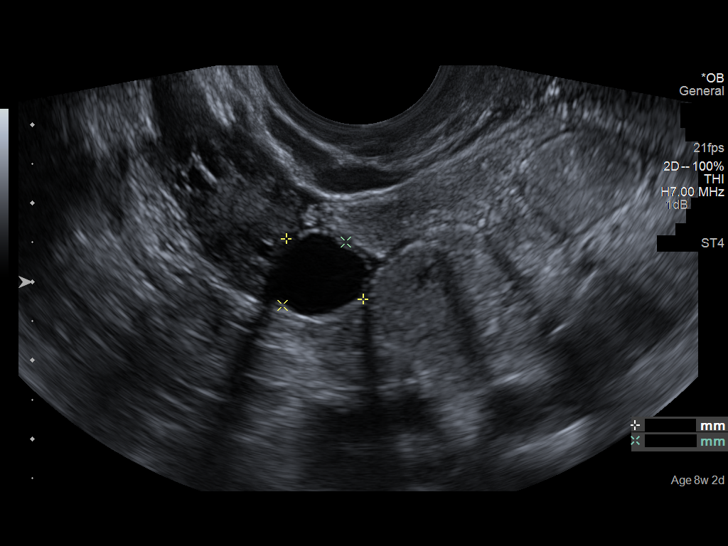
[im 39/62]
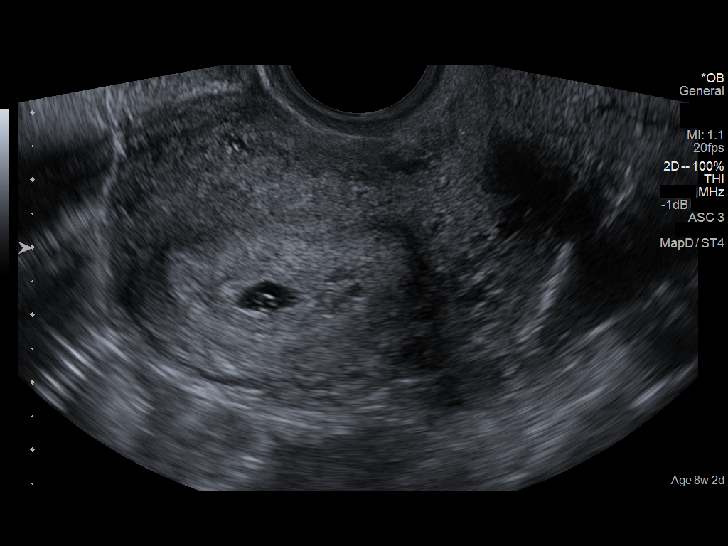
[im 43/62]
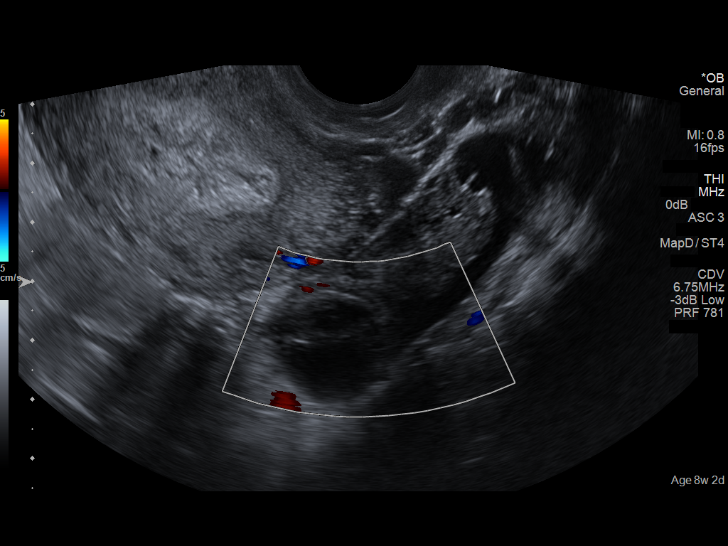
[im 48/62]
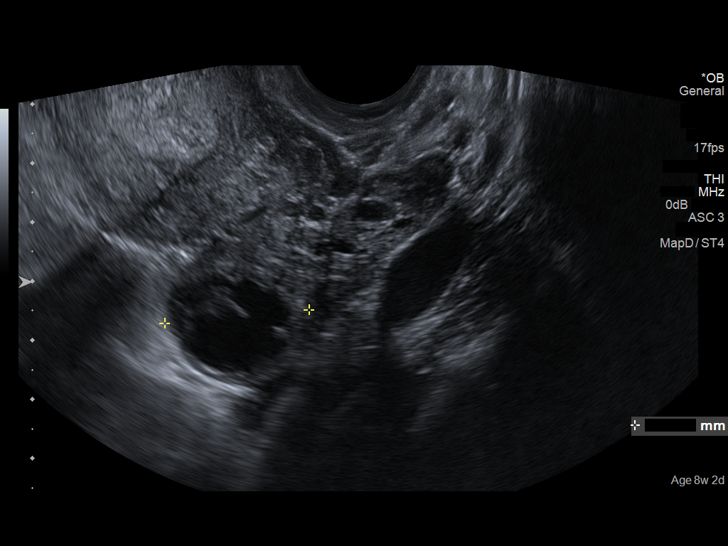
[im 52/62]
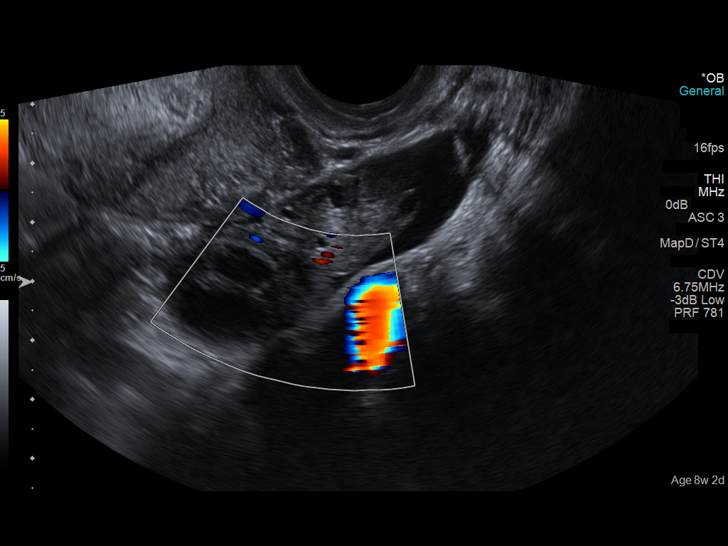
[im 57/62]
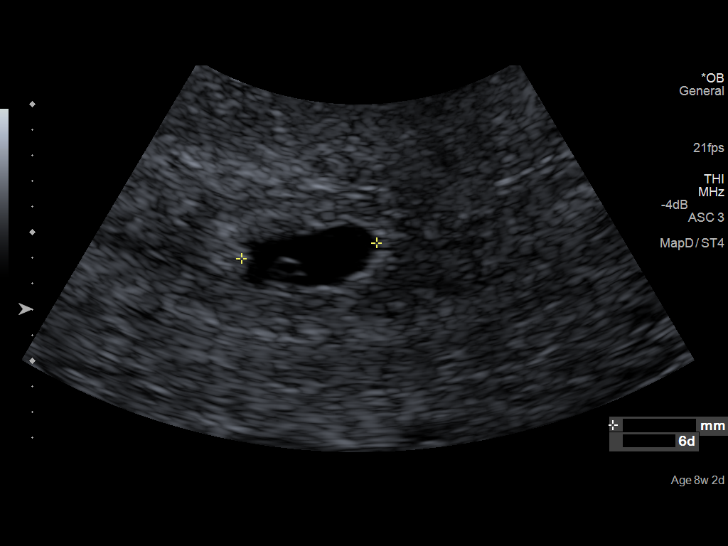
[im 62/62]
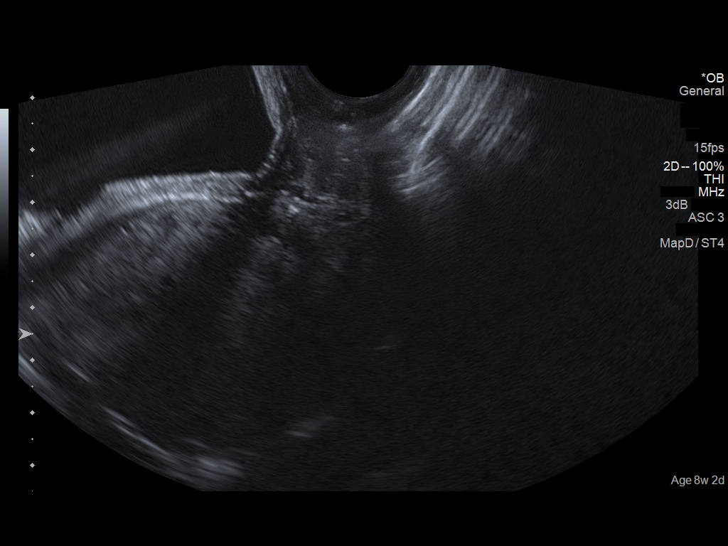

[14 of 28 positions shown; findings below may reference images not displayed]

FINDINGS: Intrauterine gestational sac: Visualized/normal in shape.

Yolk sac:  Visualized

Embryo:  Not visualized

Cardiac Activity: No embryo visualized

MSD: 8  mm   5 w   4  d   US EDC: 01/20/2016

Maternal uterus/adnexae: Right ovarian simple appearing paraovarian
cyst incidentally noted. Left ovarian corpus luteum.

Small amount of pelvic free fluid.
IMPRESSION: Intrauterine gestational sac and yolk sac but no fetal pole or
cardiac activity yet visualized. Followup ultrasound is recommended
in 10-14 days to determine appropriate pregnancy progression and for
purposes of accurate dating.

## 2016-06-14 IMAGING — US US OB COMP LESS 14 WK
1 series · 13 of 28 positions shown · non-contrast
Comparison: 05/24/2015

CLINICAL DATA: Pregnant patient with abdominal pain and dizziness.

EXAM:
OBSTETRIC <14 WK US AND TRANSVAGINAL OB US
TECHNIQUE: Both transabdominal and transvaginal ultrasound examinations were
performed for complete evaluation of the gestation as well as the
maternal uterus, adnexal regions, and pelvic cul-de-sac.
Transvaginal technique was performed to assess early pregnancy.

[Series 1: us ob comp less 14 wk · 0.17mm/px · 13 of 32 slices shown]
[im 2/32]
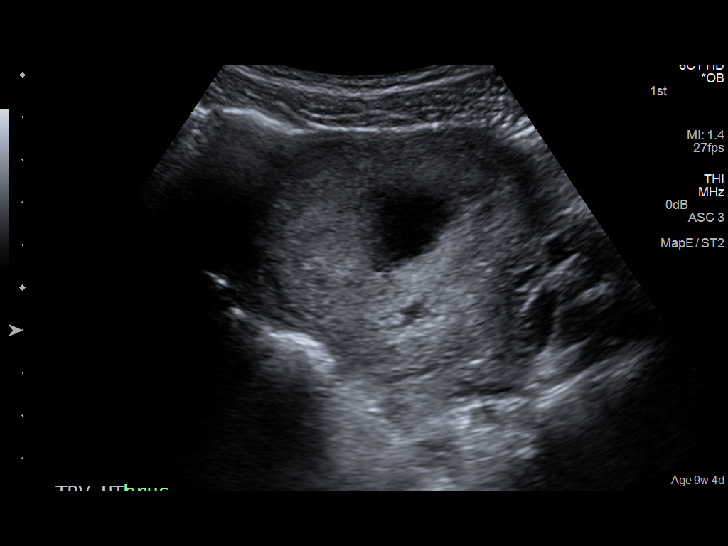
[im 4/32]
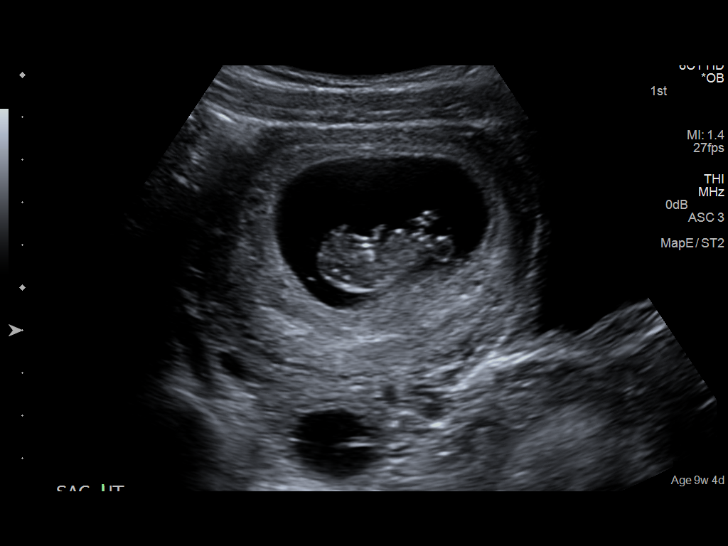
[im 6/32]
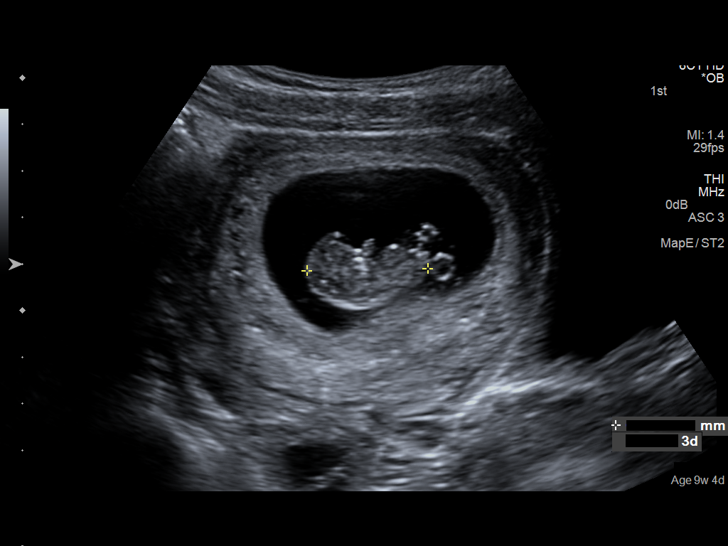
[im 9/32]
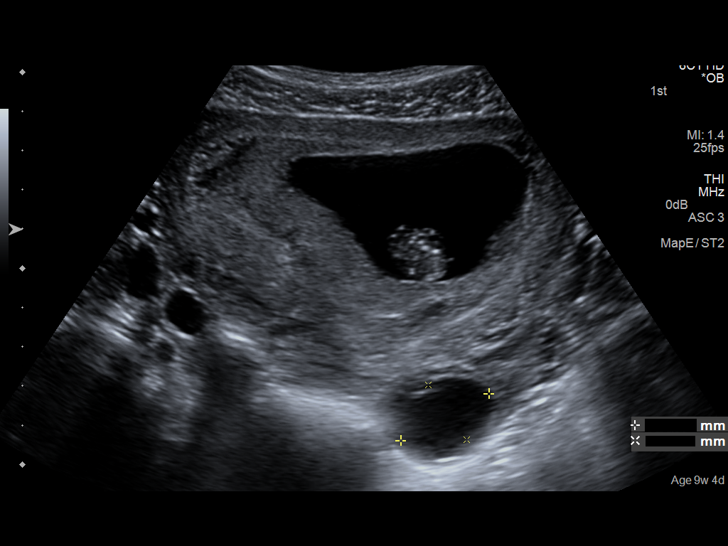
[im 11/32]
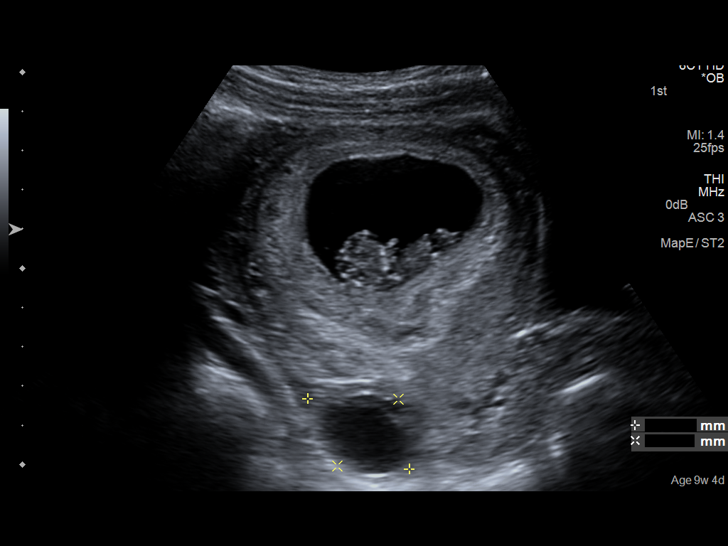
[im 13/32]
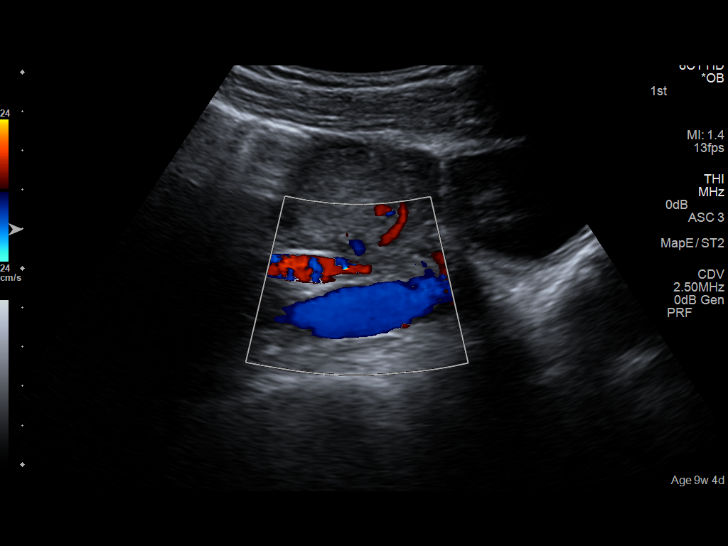
[im 17/32]
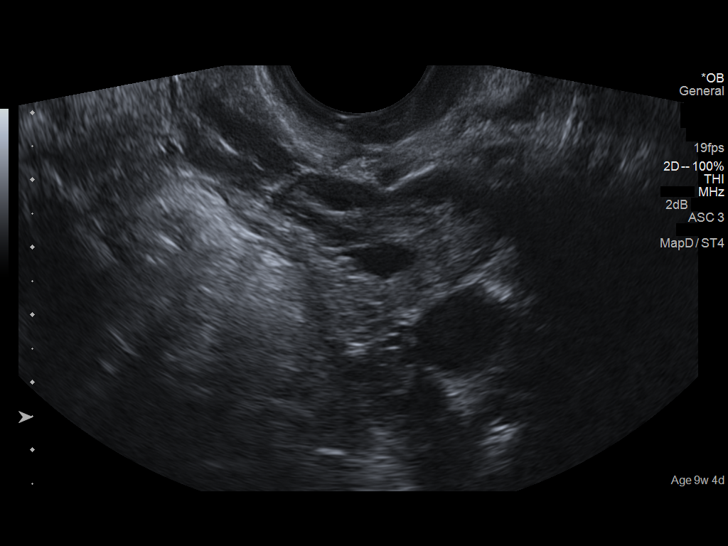
[im 19/32]
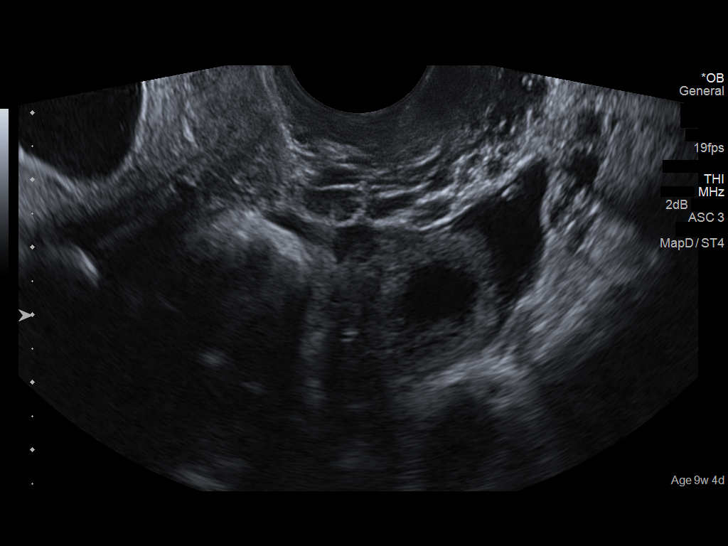
[im 21/32]
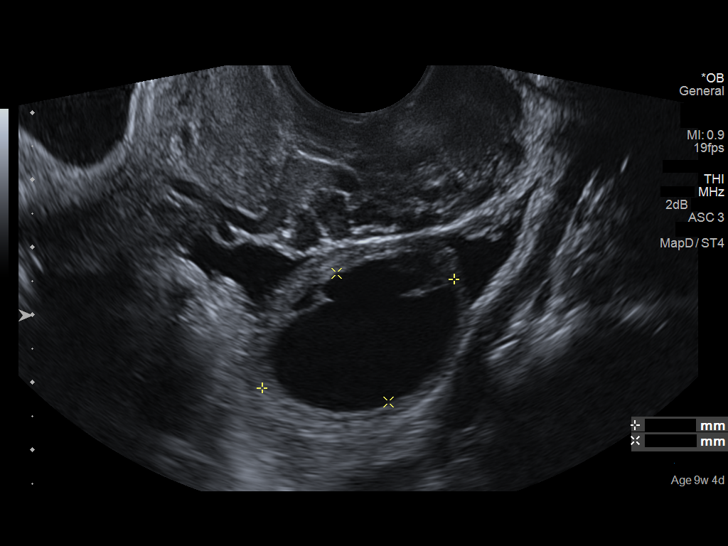
[im 23/32]
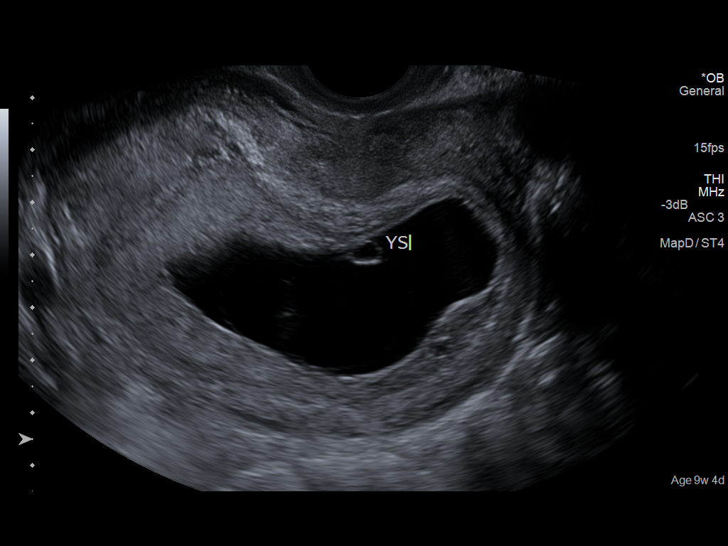
[im 26/32]
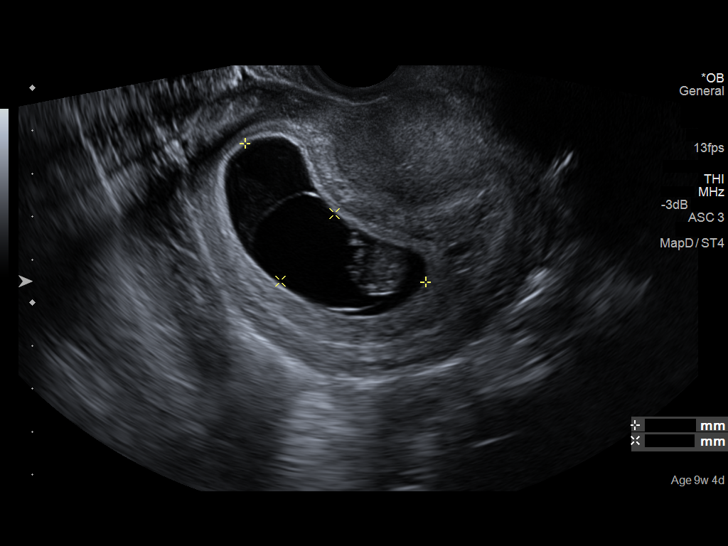
[im 28/32]
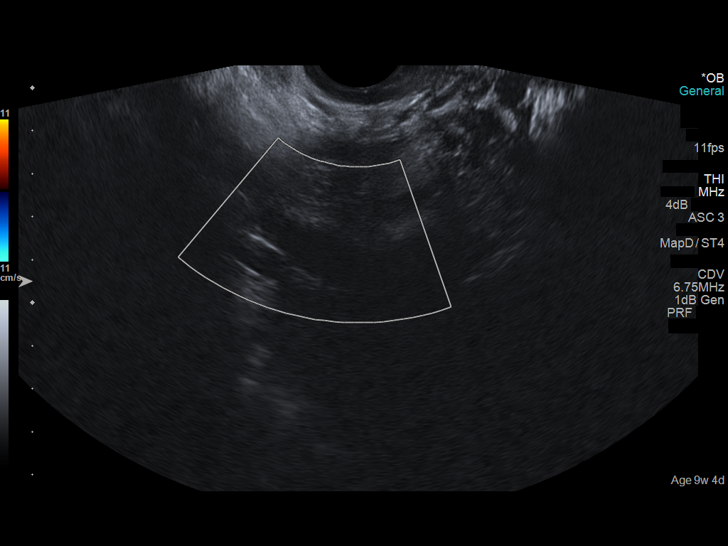
[im 30/32]
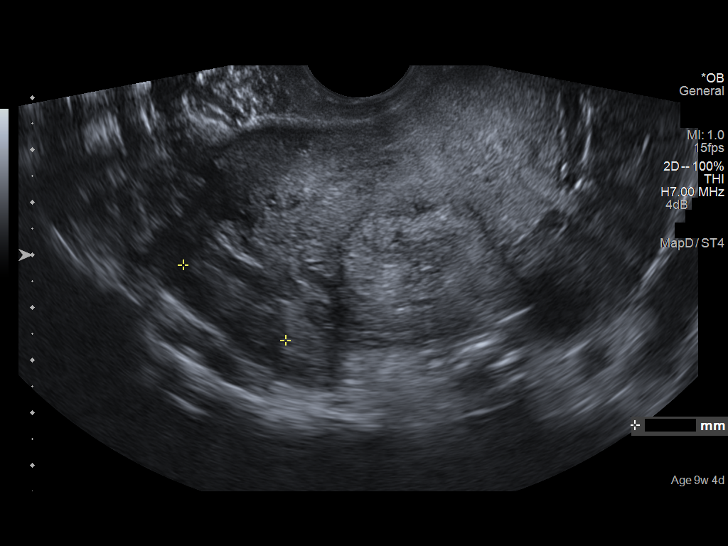

[13 of 28 positions shown; findings below may reference images not displayed]

FINDINGS: Intrauterine gestational sac: Visualized/normal in shape.

Yolk sac:  Present.

Embryo:  Present.

Cardiac Activity: Present.

Heart Rate: 182  bpm

CRL:  27.4  mm   9 w   4 d                  US EDC: 01/22/2016

Maternal uterus/adnexae: No subchorionic hemorrhage. There is a
complex cyst in the left ovary measuring 3.3 x 2.1 x 2.6 cm with
septation versus 2 adjacent cysts. Blood flow is noted to the
ovarian parenchyma. The right ovary is normal. Small amount of free
fluid in the pelvis.
IMPRESSION: 1. Single live intrauterine pregnancy estimated gestational age 9
weeks 4 days for estimated date of delivery 01/22/2016. There is
mild fetal tachycardia with fetal heart rate of 182 beats per
minute. Close obstetric follow-up recommended.
2. Septated cyst in the left ovary versus 2 adjacent cysts,
measuring 3.3 cm.

## 2016-06-28 IMAGING — US US OB COMP LESS 14 WK
1 series · 14 of 28 positions shown · non-contrast
Comparison: 06/23/2015

CLINICAL DATA: Pelvic discomfort and spotting.

EXAM:
OBSTETRIC <14 WK ULTRASOUND
TECHNIQUE: Transabdominal ultrasound was performed for evaluation of the
gestation as well as the maternal uterus and adnexal regions.

[Series 1: us ob comp less 14 wk · 0.24mm/px · 14 of 47 slices shown]
[im 2/47]
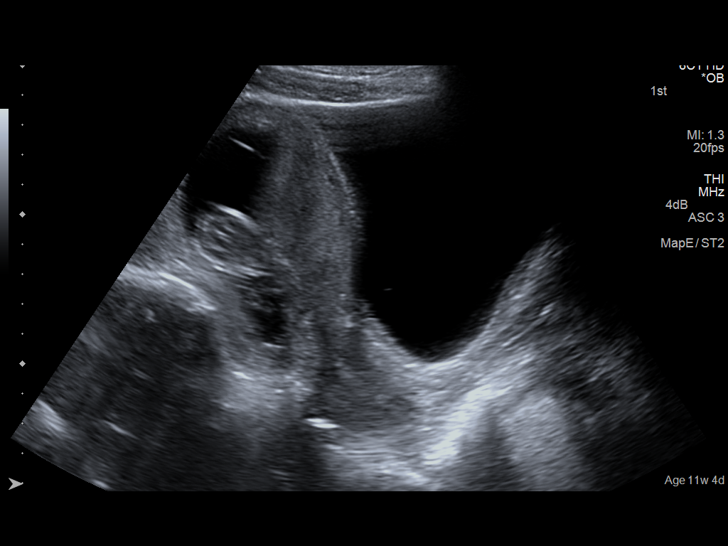
[im 6/47]
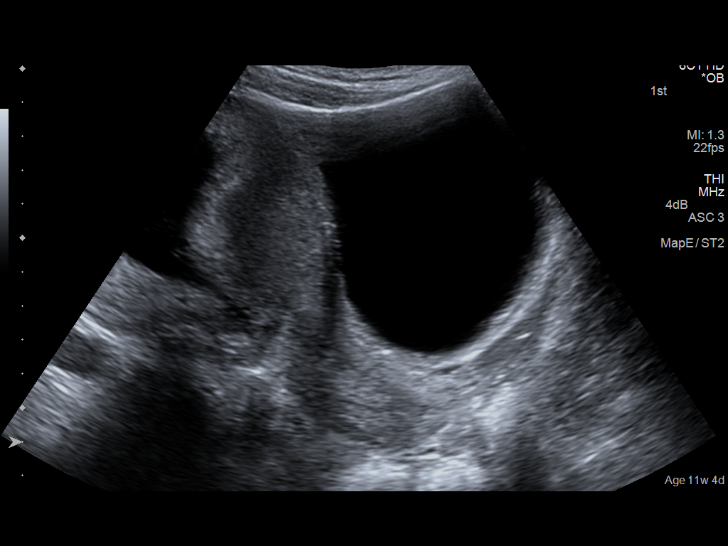
[im 9/47]
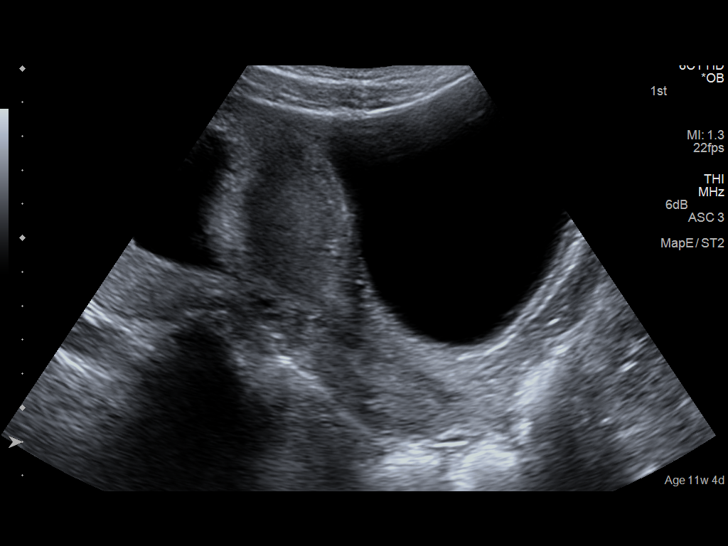
[im 12/47]
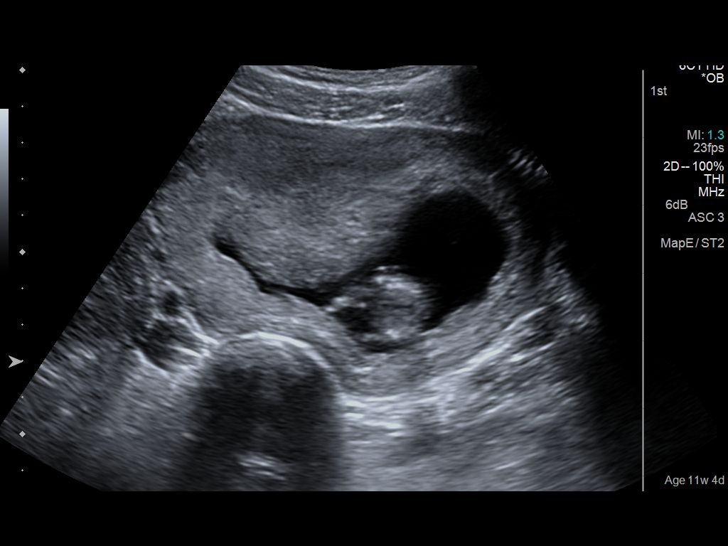
[im 16/47]
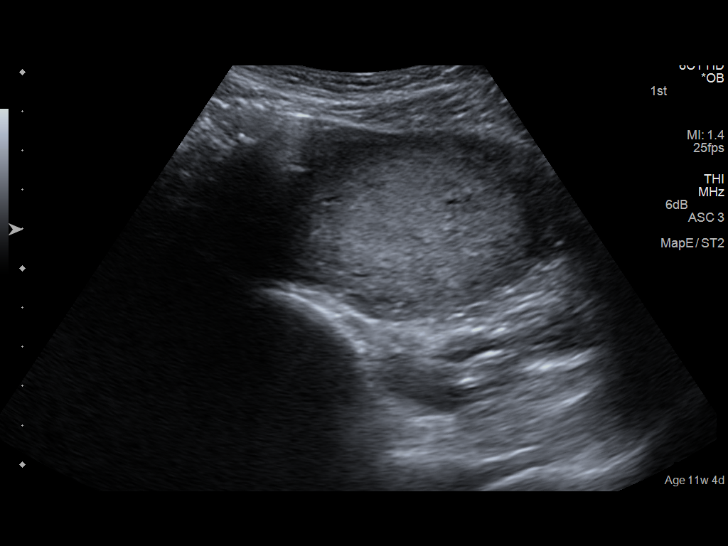
[im 19/47]
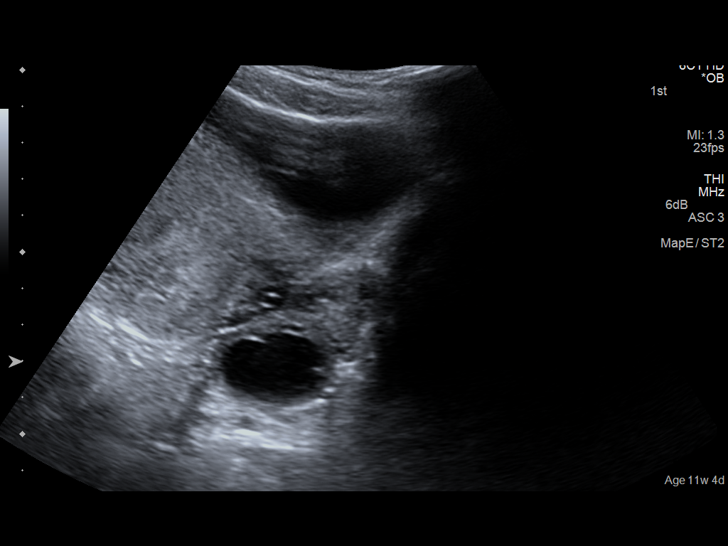
[im 23/47]
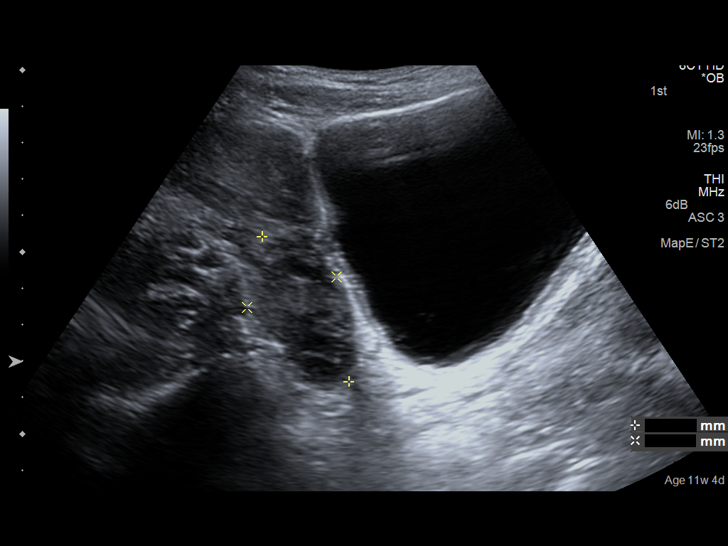
[im 26/47]
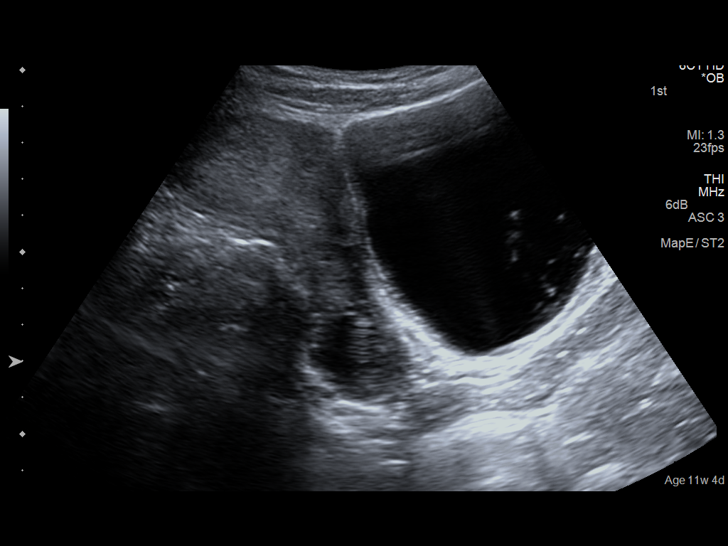
[im 29/47]
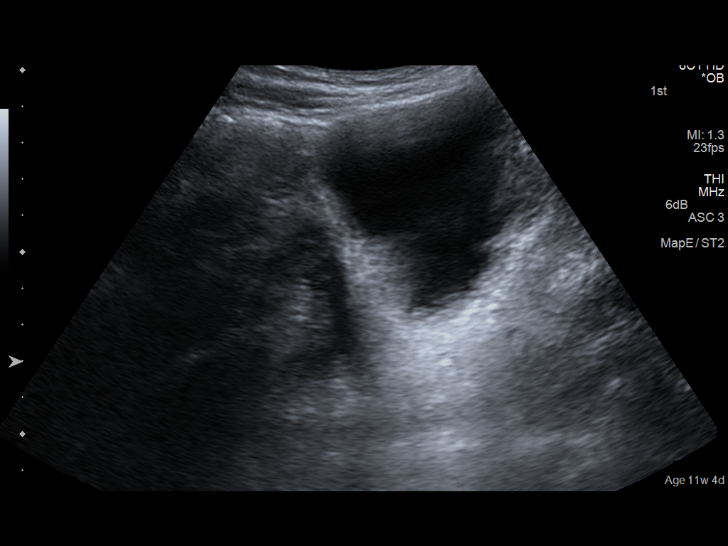
[im 33/47]
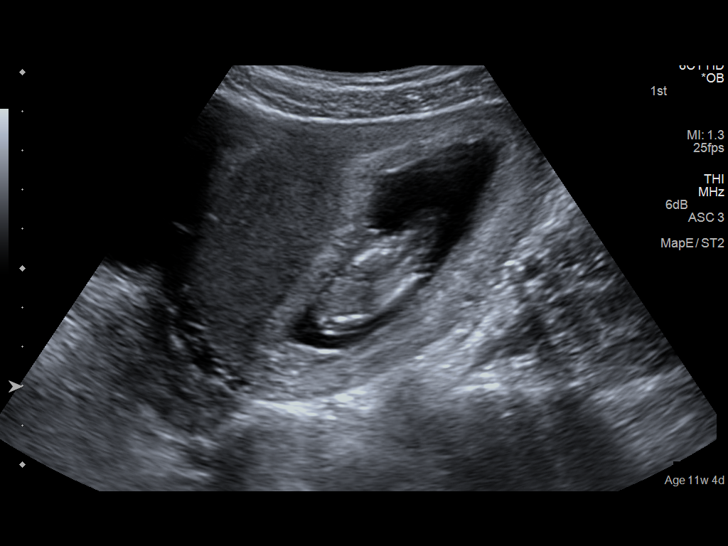
[im 36/47]
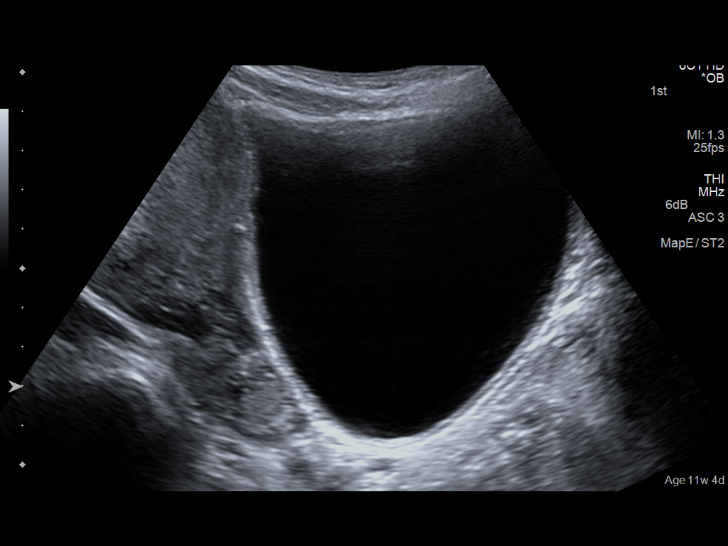
[im 40/47]
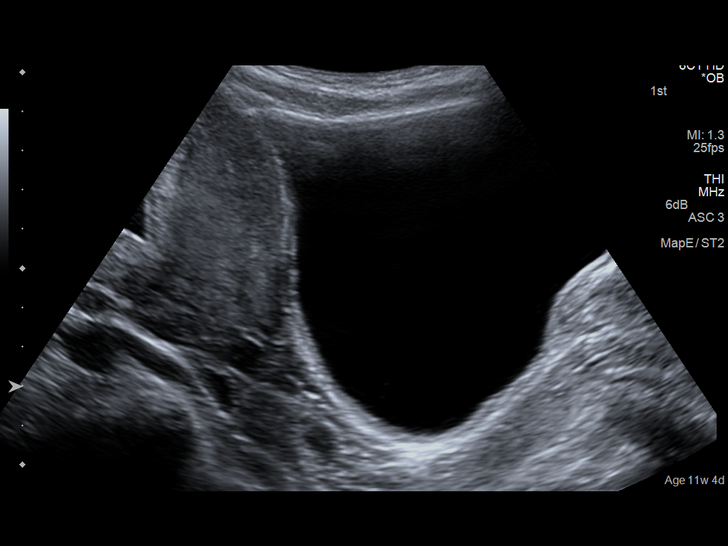
[im 43/47]
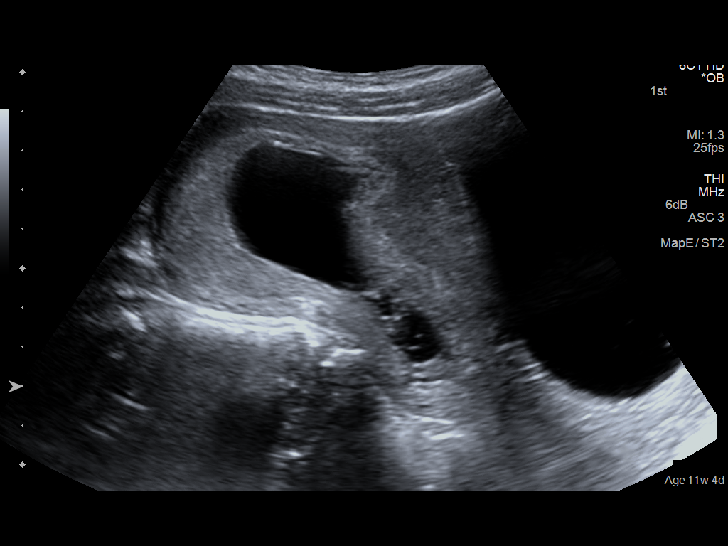
[im 47/47]
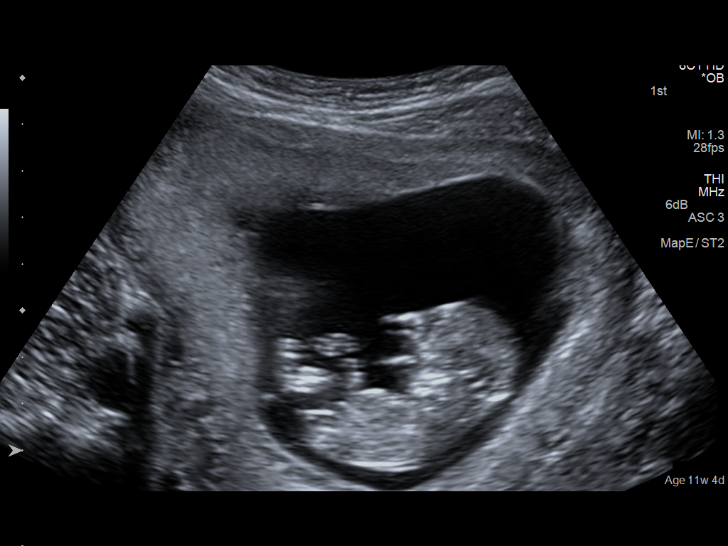

[14 of 28 positions shown; findings below may reference images not displayed]

FINDINGS: Intrauterine gestational sac: Visualized/normal in shape.

Yolk sac:  None

Embryo:  Present

Cardiac Activity: Present

Heart Rate: 162 bpm

CRL:   53  mm   12 w 0 d                  US EDC: 01/19/2016

Maternal uterus/adnexae: Minimally complex left ovarian cystic
structure measuring up to 2.9 cm and similar morphology to the
previous examination. Left ovary measures 3.8 x 4.6 x 2.6 cm. Right
ovary measures 1.8 x 3.7 x 1.8 cm. No significant subchorionic
hemorrhage.
IMPRESSION: Single live intrauterine pregnancy. Calculated gestational age by
ultrasound is 12 weeks 0 days.

## 2016-12-20 ENCOUNTER — Telehealth (HOSPITAL_COMMUNITY): Payer: Self-pay | Admitting: Lactation Services

## 2017-01-14 ENCOUNTER — Encounter (HOSPITAL_COMMUNITY): Payer: Self-pay

## 2017-01-14 ENCOUNTER — Emergency Department (HOSPITAL_COMMUNITY)
Admission: EM | Admit: 2017-01-14 | Discharge: 2017-01-14 | Disposition: A | Discharge status: Home / Self Care | Payer: Medicaid Other | Attending: Emergency Medicine | Admitting: Emergency Medicine

## 2017-01-14 DIAGNOSIS — Z79899 Other long term (current) drug therapy: Secondary | ICD-10-CM | POA: Insufficient documentation

## 2017-01-14 DIAGNOSIS — Z3201 Encounter for pregnancy test, result positive: Secondary | ICD-10-CM | POA: Insufficient documentation

## 2017-01-14 LAB — POC URINE PREG, ED: PREG TEST UR: POSITIVE — AB

## 2017-01-14 NOTE — ED Triage Notes (Signed)
Pt states she is pregnant; pt states some slight abdominal cramping at 4/10 but denies n/v; pt states she wants test and referral if needed; pt has recent pregnancy and currently breast feeds;

## 2017-01-14 NOTE — Discharge Instructions (Signed)
Continue to take prenatal vitamins. Start your prenatal care. If you have pregnancy problems, go to [facility].

## 2017-01-14 NOTE — ED Provider Notes (Signed)
[facility] DEPT Provider Note   CSN: [id number] Arrival date & time: 01/14/17  2142  By signing my name below, I, Nicole Fuller, attest that this documentation has been prepared under the direction and in the presence of [facility], [place]. Electronically Signed: Karren Cobble, ED Scribe. 01/14/17. 11:03 PM.   History   Chief Complaint Chief Complaint  Patient presents with  . Possible Pregnancy   The history is provided by the patient and a significant other. No language interpreter was used.  Possible Pregnancy  This is a new problem. The problem has not changed since onset.Pertinent negatives include no abdominal pain. Nothing aggravates the symptoms. Nothing relieves the symptoms. She has tried nothing for the symptoms. The treatment provided no relief.    HPI Comments: Nicole Fuller is a G76P1001 26 y.o. female with no pertinent PMHx, who presents to the Emergency Department requesting a pregnancy test. Pt notes she has taken two at home pregnancy test today that where positive, and states that she needs an official pregnancy test and referral for further management of this pregnancy. Per pt, her LNMP occurred on 12/11/16 ([redacted]w[redacted]d ago) She is currently breast feeding. She denies urinary frequency, abdominal pain.  Past Medical History:  Diagnosis Date  . Medical history non-contributory    Patient Active Problem List   Diagnosis Date Noted  . Active labor 01/11/2016  . Labor and delivery, indication for care 01/11/2016  . Rubella non-immune status, antepartum 01/11/2016   Past Surgical History:  Procedure Laterality Date  . NO PAST SURGERIES     OB History    Gravida Para Term Preterm AB Living   SAB TAB Ectopic Multiple Live Births         0 1     Home Medications    Prior to Admission medications   Medication Sig Start Date End Date Taking? Authorizing Provider  acetaminophen (TYLENOL) 325 MG tablet Take 2 tablets (650 mg total) by mouth every 4 (four)  hours as needed (for pain scale < 4). 01/13/16   Caesar Chestnut, MD  Prenatal Vit-Fe Fumarate-FA (PRENATAL COMPLETE) 14-0.4 MG TABS Take 1 tablet by mouth daily. 05/24/15   [name], PA-C    Family History Family History  Problem Relation Age of Onset  . Cancer Mother   . Diabetes Maternal Uncle   . Diabetes Maternal Grandmother    Social History Social History  Substance Use Topics  . Smoking status: Never Smoker  . Smokeless tobacco: Not on file  . Alcohol use No     Allergies   Promethazine; Aspirin; and Nsaids   Review of Systems Review of Systems  Gastrointestinal: Negative for abdominal pain.  Genitourinary: Negative for frequency.  All other systems reviewed and are negative.  Physical Exam Updated Vital Signs BP 121/83 (BP Location: Right Arm)   Pulse 74   Temp 98.8 F (37.1 C) (Oral)   Resp 18   LMP 12/10/2016   SpO2 100%   Breastfeeding? Yes   Physical Exam  Constitutional: She is oriented to person, place, and time. She appears well-developed and well-nourished.  HENT:  Head: Normocephalic and atraumatic.  Cardiovascular: Normal rate.   Pulmonary/Chest: Effort normal.  Abdominal: Soft. Bowel sounds are normal. There is no tenderness.  Neurological: She is alert and oriented to person, place, and time.  Skin: Skin is warm and dry.  Psychiatric: She has a normal mood and affect.  Nursing note and vitals  reviewed.  ED Treatments / Results   DIAGNOSTIC STUDIES: Oxygen Saturation is 100% on RA, normal by my interpretation.   COORDINATION OF CARE: 10:52 PM-Discussed next steps with pt. Pt verbalized understanding and is agreeable with the plan.   Labs (all labs ordered are listed, but only abnormal results are displayed) Labs Reviewed  POC URINE PREG, ED - Abnormal; Notable for the following:       Result Value   Preg Test, Ur POSITIVE (*)    All other components within normal limits    Radiology No results found.  Procedures Procedures    Medications Ordered in ED Medications - No data to display   Initial Impression / Assessment and Plan / ED Course  I have reviewed the triage vital signs and the nursing notes.  Pt is a now G2P1001 25yoF who presents requesting pregnancy testing, confirmation, and referral for prenatal care. Urine pregnancy was positive in the ED. She was given a confirmation of this and I have referred her into the [facility]. No urinary frequency, abdominal pain. She is stable for d/c at this time. Pt is comfortable with above plan and is stable for discharge at this time. All questions were answered prior to disposition. Strict return precautions for return into the ED were discussed.   Final Clinical Impressions(s) / ED Diagnoses   Final diagnoses:  Positive pregnancy test   New Prescriptions Discharge Medication List as of 01/14/2017 11:03 PM     I personally performed the services described in this documentation, which was scribed in my presence. The recorded information has been reviewed and is accurate.     [place], [place] 01/15/17 2101    Lyndal Pulley, MD 01/16/17 [phone]

## 2017-01-14 NOTE — ED Notes (Signed)
POC preg test is POSITIVE

## 2017-01-15 ENCOUNTER — Emergency Department (HOSPITAL_COMMUNITY)
Admission: EM | Admit: 2017-01-15 | Discharge: 2017-01-16 | Disposition: A | Discharge status: Home / Self Care | Payer: Medicaid Other | Attending: Emergency Medicine | Admitting: Emergency Medicine

## 2017-01-15 ENCOUNTER — Encounter (HOSPITAL_COMMUNITY): Payer: Self-pay | Admitting: *Deleted

## 2017-01-15 DIAGNOSIS — Z3A01 Less than 8 weeks gestation of pregnancy: Secondary | ICD-10-CM | POA: Diagnosis not present

## 2017-01-15 DIAGNOSIS — B373 Candidiasis of vulva and vagina: Secondary | ICD-10-CM | POA: Diagnosis not present

## 2017-01-15 DIAGNOSIS — O0281 Inappropriate change in quantitative human chorionic gonadotropin (hCG) in early pregnancy: Secondary | ICD-10-CM | POA: Diagnosis not present

## 2017-01-15 DIAGNOSIS — O23591 Infection of other part of genital tract in pregnancy, first trimester: Secondary | ICD-10-CM | POA: Insufficient documentation

## 2017-01-15 DIAGNOSIS — Z3491 Encounter for supervision of normal pregnancy, unspecified, first trimester: Secondary | ICD-10-CM

## 2017-01-15 DIAGNOSIS — B3731 Acute candidiasis of vulva and vagina: Secondary | ICD-10-CM

## 2017-01-15 DIAGNOSIS — O208 Other hemorrhage in early pregnancy: Secondary | ICD-10-CM | POA: Diagnosis present

## 2017-01-15 LAB — CBC WITH DIFFERENTIAL/PLATELET
Basophils Absolute: 0 10*3/uL (ref 0.0–0.1)
Basophils Relative: 1 %
EOS ABS: 0.1 10*3/uL (ref 0.0–0.7)
Eosinophils Relative: 1 %
HEMATOCRIT: 36 % (ref 36.0–46.0)
Hemoglobin: 12.4 g/dL (ref 12.0–15.0)
LYMPHS ABS: 3 10*3/uL (ref 0.7–4.0)
LYMPHS PCT: 52 %
MCH: 30.6 pg (ref 26.0–34.0)
MCHC: 34.4 g/dL (ref 30.0–36.0)
MCV: 88.9 fL (ref 78.0–100.0)
MONOS PCT: 6 %
Monocytes Absolute: 0.4 10*3/uL (ref 0.1–1.0)
NEUTROS ABS: 2.3 10*3/uL (ref 1.7–7.7)
NEUTROS PCT: 40 %
Platelets: 216 10*3/uL (ref 150–400)
RBC: 4.05 MIL/uL (ref 3.87–5.11)
RDW: 11.9 % (ref 11.5–15.5)
WBC: 5.8 10*3/uL (ref 4.0–10.5)

## 2017-01-15 LAB — WET PREP, GENITAL
Clue Cells Wet Prep HPF POC: NONE SEEN
Sperm: NONE SEEN
Trich, Wet Prep: NONE SEEN

## 2017-01-15 LAB — HCG, QUANTITATIVE, PREGNANCY: hCG, Beta Chain, Quant, S: 37298 m[IU]/mL — ABNORMAL HIGH (ref <5)

## 2017-01-15 NOTE — ED Provider Notes (Signed)
[facility] Provider Note   CSN: [id number] Arrival date & time: 01/15/17  1658  By signing my name below, I, Linna Darner, attest that this documentation has been prepared under the direction and in the presence of [facility] M. Damian Leavell, NP. Electronically Signed: Linna Darner, Scribe. 01/15/2017. 9:38 PM.  History   Chief Complaint Chief Complaint  Patient presents with  . Vaginal Discharge    The history is provided by the patient. No language interpreter was used.  Vaginal Discharge   This is a new problem. The current episode started 6 to 12 hours ago. The problem occurs hourly. The problem has not changed since onset.The discharge occurs spontaneously. The discharge was malodorous and copious. She is pregnant. Associated symptoms include abdominal pain. Pertinent negatives include no fever, no nausea, no vomiting, no dysuria, no frequency and no genital lesions. She has tried nothing for the symptoms.     HPI Comments: Nicole Fuller is a 26 y.o. female who presents to the Emergency Department complaining of persistent vaginal discharge beginning this morning. She describes her discharge as foul-smelling and states it has been copious. Pt notes she has had vaginal discharge in the past but never as much as her current onset. She notes some associated cramping in her left lower abdomen and states she has noticed small amounts of blood on the toilet tissue today upon wiping after urinating. Pt was seen here yesterday requesting a pregnancy test and was informed that she was [redacted] weeks pregnant. She did not have any of her current symptoms when she presented here yesterday. She has no other complaints at this time.   Past Medical History:  Diagnosis Date  . Medical history non-contributory     Patient Active Problem List   Diagnosis Date Noted  . Active labor 01/11/2016  . Labor and delivery, indication for care 01/11/2016  . Rubella non-immune status, antepartum 01/11/2016     Past Surgical History:  Procedure Laterality Date  . NO PAST SURGERIES      OB History    Gravida Para Term Preterm AB Living   SAB TAB Ectopic Multiple Live Births         0 1       Home Medications    Prior to Admission medications   Medication Sig Start Date End Date Taking? Authorizing Provider  acetaminophen (TYLENOL) 325 MG tablet Take 2 tablets (650 mg total) by mouth every 4 (four) hours as needed (for pain scale < 4). 01/13/16   Caesar Chestnut, MD  Prenatal Vit-Fe Fumarate-FA (PRENATAL COMPLETE) 14-0.4 MG TABS Take 1 tablet by mouth daily. 05/24/15   [name], PA-C  terconazole (TERAZOL 3) 0.8 % vaginal cream Place 1 applicator vaginally at bedtime. 01/16/17   Hope Orlene Och, NP    Family History Family History  Problem Relation Age of Onset  . Cancer Mother   . Diabetes Maternal Uncle   . Diabetes Maternal Grandmother     Social History Social History  Substance Use Topics  . Smoking status: Never Smoker  . Smokeless tobacco: Not on file  . Alcohol use No     Allergies   Promethazine; Aspirin; and Nsaids   Review of Systems Review of Systems  Constitutional: Negative for fever.  Gastrointestinal: Positive for abdominal pain. Negative for nausea and vomiting.  Genitourinary: Positive for hematuria and vaginal discharge. Negative for dysuria and frequency.  All other systems reviewed and are negative.  Physical Exam Updated Vital Signs BP 113/66 (BP Location: Right Arm)   Pulse 71   Temp 98.4 F (36.9 C) (Oral)   Resp 15   SpO2 99%   Physical Exam  Constitutional: She is oriented to person, place, and time. She appears well-developed and well-nourished. No distress.  HENT:  Head: Normocephalic and atraumatic.  Eyes: Conjunctivae and EOM are normal.  Neck: Neck supple. No tracheal deviation present.  Cardiovascular: Normal rate.   Pulmonary/Chest: Effort normal. No respiratory distress.  Abdominal: Soft. There is no  tenderness.  Genitourinary: There is no lesion on the right labia. There is no lesion on the left labia. Uterus is not enlarged. Cervix exhibits no motion tenderness. Right adnexum displays no mass and no tenderness. Left adnexum displays no mass and no tenderness. Vaginal discharge found.  Genitourinary Comments: External genitalia without lesions. Copious white discharge. Cervix long and closed. No CMT. No adnexal tenderness or mass palpated. Uterus slightly enlarged.  Musculoskeletal: Normal range of motion.  Neurological: She is alert and oriented to person, place, and time.  Skin: Skin is warm and dry.  Psychiatric: She has a normal mood and affect. Her behavior is normal.  Nursing note and vitals reviewed.  ED Treatments / Results  Labs (all labs ordered are listed, but only abnormal results are displayed) Labs Reviewed  WET PREP, GENITAL - Abnormal; Notable for the following:       Result Value   Yeast Wet Prep HPF POC PRESENT (*)    WBC, Wet Prep HPF POC MANY (*)    All other components within normal limits  HCG, QUANTITATIVE, PREGNANCY - Abnormal; Notable for the following:    hCG, Beta Chain, Quant, S 37,298 (*)    All other components within normal limits  CBC WITH DIFFERENTIAL/PLATELET  RPR  HIV ANTIBODY (ROUTINE TESTING)  GC/CHLAMYDIA PROBE AMP ([facility]) NOT AT [facility]   Radiology US Ob Comp Less 14 Wks  Result Date: 01/16/2017 CLINICAL DATA:  Acute onset of vaginal bleeding.  Initial encounter. EXAM: OBSTETRIC <14 WK [place] AND TRANSVAGINAL OB US TECHNIQUE: Both transabdominal and transvaginal ultrasound examinations were performed for complete evaluation of the gestation as well as the maternal uterus, adnexal regions, and pelvic cul-de-sac. Transvaginal technique was performed to assess early pregnancy. COMPARISON:  Pelvic ultrasound performed 07/07/2015 FINDINGS: Intrauterine gestational sac: Single; visualized and normal in shape. Yolk sac:  Yes Embryo:  Yes Cardiac  Activity: Yes Heart Rate: 122  bpm CRL:  5.1  mm   6 w   2 d                  [place] EDC: 09/09/2017 Subchorionic hemorrhage:  None visualized. Maternal uterus/adnexae: The uterus is otherwise unremarkable in appearance. The ovaries are within normal limits. The right ovary measures 3.9 x 2.0 x 1.9 cm, while the left ovary measures 2.8 x 1.7 x 1.7 cm. No suspicious adnexal masses are seen; there is no evidence for ovarian torsion. No free fluid is seen within the pelvic cul-de-sac. IMPRESSION: Single live intrauterine pregnancy noted, with a crown-rump length of 5 mm, corresponding to a gestational age of [redacted] weeks 2 days. This does not match the gestational age by LMP, and reflects a new estimated date of delivery of September 09, 2017. Electronically Signed   By: Roanna Raider M.D.   On: 01/16/2017 01:24   US Ob Transvaginal  Result Date: 01/16/2017 CLINICAL DATA:  Acute onset of vaginal bleeding.  Initial encounter. EXAM: OBSTETRIC <14  WK [place] AND TRANSVAGINAL OB US TECHNIQUE: Both transabdominal and transvaginal ultrasound examinations were performed for complete evaluation of the gestation as well as the maternal uterus, adnexal regions, and pelvic cul-de-sac. Transvaginal technique was performed to assess early pregnancy. COMPARISON:  Pelvic ultrasound performed 07/07/2015 FINDINGS: Intrauterine gestational sac: Single; visualized and normal in shape. Yolk sac:  Yes Embryo:  Yes Cardiac Activity: Yes Heart Rate: 122  bpm CRL:  5.1  mm   6 w   2 d                  [place] EDC: 09/09/2017 Subchorionic hemorrhage:  None visualized. Maternal uterus/adnexae: The uterus is otherwise unremarkable in appearance. The ovaries are within normal limits. The right ovary measures 3.9 x 2.0 x 1.9 cm, while the left ovary measures 2.8 x 1.7 x 1.7 cm. No suspicious adnexal masses are seen; there is no evidence for ovarian torsion. No free fluid is seen within the pelvic cul-de-sac. IMPRESSION: Single live intrauterine pregnancy noted,  with a crown-rump length of 5 mm, corresponding to a gestational age of [redacted] weeks 2 days. This does not match the gestational age by LMP, and reflects a new estimated date of delivery of September 09, 2017. Electronically Signed   By: Roanna Raider M.D.   On: 01/16/2017 01:24    Procedures Procedures (including critical care time)  DIAGNOSTIC STUDIES: Oxygen Saturation is 99% on RA, normal by my interpretation.    COORDINATION OF CARE: 9:45 PM Discussed treatment plan with pt at bedside and pt agreed to plan.  Medications Ordered in ED Medications - No data to display   Initial Impression / Assessment and Plan / ED Course  I have reviewed the triage vital signs and the nursing notes.  Pertinent labs & imaging results that were available during my care of the patient were reviewed by me and considered in my medical decision making (see chart for details).  Final Clinical Impressions(s) / ED Diagnoses  26 y.o. G2P1001 @ 6 weeks and 2 days gestation by ultrasound tonight stable for d/c without fever, vaginal bleeding or abdominal pain. Will treat for yeast infection and patient to start prenatal care.  Final diagnoses:  Vaginal yeast infection  Normal IUP (intrauterine pregnancy) on prenatal ultrasound, first trimester    New Prescriptions New Prescriptions   TERCONAZOLE (TERAZOL 3) 0.8 % VAGINAL CREAM    Place 1 applicator vaginally at bedtime.   I personally performed the services described in this documentation, which was scribed in my presence. The recorded information has been reviewed and is accurate.    [address] [place], [place] 01/16/17 1610    Zadie Rhine, MD 01/17/17 [phone]

## 2017-01-15 NOTE — ED Notes (Signed)
Called ultrasound to see where patient was in line and they stated she was next.

## 2017-01-15 NOTE — ED Triage Notes (Signed)
Pt states that she was seen yesterday and told that she was [redacted] weeks pregnant. Pt states that today she began having vaginal discharge with odor.

## 2017-01-16 ENCOUNTER — Emergency Department (HOSPITAL_COMMUNITY): Payer: Medicaid Other

## 2017-01-16 LAB — RPR: RPR Ser Ql: NONREACTIVE

## 2017-01-16 LAB — HIV ANTIBODY (ROUTINE TESTING W REFLEX): HIV Screen 4th Generation wRfx: NONREACTIVE

## 2017-01-16 LAB — GC/CHLAMYDIA PROBE AMP (~~LOC~~) NOT AT ARMC
Chlamydia: NEGATIVE
NEISSERIA GONORRHEA: NEGATIVE

## 2017-01-16 MED ORDER — TERCONAZOLE 0.8 % VA CREA: 1.0000 | TOPICAL_CREAM | Freq: Every day | VAGINAL | 0 refills | Status: DC

## 2017-01-16 NOTE — Discharge Instructions (Signed)
US Ob Comp Less 14 Wks  Result Date: 01/16/2017 CLINICAL DATA:  Acute onset of vaginal bleeding.  Initial encounter. EXAM: OBSTETRIC <14 WK [place] AND TRANSVAGINAL OB US TECHNIQUE: Both transabdominal and transvaginal ultrasound examinations were performed for complete evaluation of the gestation as well as the maternal uterus, adnexal regions, and pelvic cul-de-sac. Transvaginal technique was performed to assess early pregnancy. COMPARISON:  Pelvic ultrasound performed 07/07/2015 FINDINGS: Intrauterine gestational sac: Single; visualized and normal in shape. Yolk sac:  Yes Embryo:  Yes Cardiac Activity: Yes Heart Rate: 122  bpm CRL:  5.1  mm   6 w   2 d                  [place] EDC: 09/09/2017 Subchorionic hemorrhage:  None visualized. Maternal uterus/adnexae: The uterus is otherwise unremarkable in appearance. The ovaries are within normal limits. The right ovary measures 3.9 x 2.0 x 1.9 cm, while the left ovary measures 2.8 x 1.7 x 1.7 cm. No suspicious adnexal masses are seen; there is no evidence for ovarian torsion. No free fluid is seen within the pelvic cul-de-sac. IMPRESSION: Single live intrauterine pregnancy noted, with a crown-rump length of 5 mm, corresponding to a gestational age of [redacted] weeks 2 days. This does not match the gestational age by LMP, and reflects a new estimated date of delivery of September 09, 2017. Electronically Signed   By: Roanna Raider M.D.   On: 01/16/2017 01:24   US Ob Transvaginal  Result Date: 01/16/2017 CLINICAL DATA:  Acute onset of vaginal bleeding.  Initial encounter. EXAM: OBSTETRIC <14 WK [place] AND TRANSVAGINAL OB US TECHNIQUE: Both transabdominal and transvaginal ultrasound examinations were performed for complete evaluation of the gestation as well as the maternal uterus, adnexal regions, and pelvic cul-de-sac. Transvaginal technique was performed to assess early pregnancy. COMPARISON:  Pelvic ultrasound performed 07/07/2015 FINDINGS: Intrauterine gestational sac: Single;  visualized and normal in shape. Yolk sac:  Yes Embryo:  Yes Cardiac Activity: Yes Heart Rate: 122  bpm CRL:  5.1  mm   6 w   2 d                  [place] EDC: 09/09/2017 Subchorionic hemorrhage:  None visualized. Maternal uterus/adnexae: The uterus is otherwise unremarkable in appearance. The ovaries are within normal limits. The right ovary measures 3.9 x 2.0 x 1.9 cm, while the left ovary measures 2.8 x 1.7 x 1.7 cm. No suspicious adnexal masses are seen; there is no evidence for ovarian torsion. No free fluid is seen within the pelvic cul-de-sac. IMPRESSION: Single live intrauterine pregnancy noted, with a crown-rump length of 5 mm, corresponding to a gestational age of [redacted] weeks 2 days. This does not match the gestational age by LMP, and reflects a new estimated date of delivery of September 09, 2017. Electronically Signed   By: Roanna Raider M.D.   On: 01/16/2017 01:24

## 2017-01-16 NOTE — ED Notes (Signed)
Pt understood dc material. NAD noted. Scripts given at dc 

## 2017-01-16 NOTE — ED Notes (Signed)
Patient transported to Ultrasound 

## 2017-02-17 ENCOUNTER — Encounter (HOSPITAL_COMMUNITY): Payer: Self-pay | Admitting: *Deleted

## 2017-02-17 ENCOUNTER — Emergency Department (HOSPITAL_COMMUNITY)
Admission: EM | Admit: 2017-02-17 | Discharge: 2017-02-17 | Disposition: A | Discharge status: Home / Self Care | Payer: Medicaid Other | Attending: Emergency Medicine | Admitting: Emergency Medicine

## 2017-02-17 DIAGNOSIS — O9989 Other specified diseases and conditions complicating pregnancy, childbirth and the puerperium: Secondary | ICD-10-CM | POA: Diagnosis not present

## 2017-02-17 DIAGNOSIS — O209 Hemorrhage in early pregnancy, unspecified: Secondary | ICD-10-CM | POA: Insufficient documentation

## 2017-02-17 DIAGNOSIS — R197 Diarrhea, unspecified: Secondary | ICD-10-CM | POA: Insufficient documentation

## 2017-02-17 DIAGNOSIS — N939 Abnormal uterine and vaginal bleeding, unspecified: Secondary | ICD-10-CM

## 2017-02-17 DIAGNOSIS — Z3A11 11 weeks gestation of pregnancy: Secondary | ICD-10-CM | POA: Insufficient documentation

## 2017-02-17 DIAGNOSIS — R102 Pelvic and perineal pain: Secondary | ICD-10-CM | POA: Insufficient documentation

## 2017-02-17 LAB — CBC
HEMATOCRIT: 35.9 % — AB (ref 36.0–46.0)
Hemoglobin: 12.2 g/dL (ref 12.0–15.0)
MCH: 30.8 pg (ref 26.0–34.0)
MCHC: 34 g/dL (ref 30.0–36.0)
MCV: 90.7 fL (ref 78.0–100.0)
Platelets: 219 10*3/uL (ref 150–400)
RBC: 3.96 MIL/uL (ref 3.87–5.11)
RDW: 12.6 % (ref 11.5–15.5)
WBC: 5.7 10*3/uL (ref 4.0–10.5)

## 2017-02-17 LAB — WET PREP, GENITAL
Clue Cells Wet Prep HPF POC: NONE SEEN
Sperm: NONE SEEN
Trich, Wet Prep: NONE SEEN
YEAST WET PREP: NONE SEEN

## 2017-02-17 LAB — HCG, QUANTITATIVE, PREGNANCY: hCG, Beta Chain, Quant, S: 98805 m[IU]/mL — ABNORMAL HIGH (ref <5)

## 2017-02-17 NOTE — ED Triage Notes (Signed)
To ED for eval of vaginal bleeding and concerned she past tissue. Pt states she is [redacted]wks pregnant. States yesterday while at work she felt abd cramps but thought she just needed water. This am woke with light cramps-had BM-and noticed large amount of blood in toilet with tissue. States she is sure the blood was not from her rectum. No abd pain now.

## 2017-02-17 NOTE — ED Provider Notes (Signed)
[facility] Provider Note   CSN: [id number] Arrival date & time: 02/17/17  1537     History   Chief Complaint Chief Complaint  Patient presents with  . Vaginal Bleeding    HPI Nicole Fuller is a 26 y.o. female.  HPI   Presents with concern for vaginal bleeding at 11wk preg. Reports having abdominal cramping yesterday.  Today, had cramping pain then reports having diarrhea.  Reports one episode of watery diarrhea.  Looked in toilet bowl and reports seeing blood in the toilet bowl.  Unknown if rectal or vaginal bleeding, however concerned it is vaginal. Came to ED immediately.  Reports wearing pad with clear vaginal discharge with small spot of bright red blood and small amt brown discharge.  No lightheadness/syncope. No chest pain. One prior pregnancy. OBGYN at [facility].  Reports blood type A positive.   Past Medical History:  Diagnosis Date  . Medical history non-contributory     Patient Active Problem List   Diagnosis Date Noted  . Active labor 01/11/2016  . Labor and delivery, indication for care 01/11/2016  . Rubella non-immune status, antepartum 01/11/2016    Past Surgical History:  Procedure Laterality Date  . NO PAST SURGERIES      OB History    Gravida Para Term Preterm AB Living   1 1 1     1    SAB TAB Ectopic Multiple Live Births         0 1       Home Medications    Prior to Admission medications   Medication Sig Start Date End Date Taking? Authorizing Provider  acetaminophen (TYLENOL) 325 MG tablet Take 2 tablets (650 mg total) by mouth every 4 (four) hours as needed (for pain scale < 4). 01/13/16   Caesar Chestnut, MD  Prenatal Vit-Fe Fumarate-FA (PRENATAL COMPLETE) 14-0.4 MG TABS Take 1 tablet by mouth daily. 05/24/15   [name], PA-C  terconazole (TERAZOL 3) 0.8 % vaginal cream Place 1 applicator vaginally at bedtime. 01/16/17   Janne Napoleon, NP    Family History Family History  Problem Relation Age of Onset  . Cancer Mother   .  Diabetes Maternal Uncle   . Diabetes Maternal Grandmother     Social History Social History  Substance Use Topics  . Smoking status: Never Smoker  . Smokeless tobacco: Never Used  . Alcohol use No     Allergies   Promethazine; Aspirin; and Nsaids   Review of Systems Review of Systems  Constitutional: Negative for fever.  HENT: Negative for sore throat.   Eyes: Negative for visual disturbance.  Respiratory: Negative for cough and shortness of breath.   Cardiovascular: Negative for chest pain.  Gastrointestinal: Positive for diarrhea. Negative for abdominal pain, nausea and vomiting.  Genitourinary: Positive for vaginal bleeding. Negative for difficulty urinating.  Musculoskeletal: Negative for back pain and neck pain.  Skin: Negative for rash.  Neurological: Negative for syncope and headaches.     Physical Exam Updated Vital Signs BP 116/72   Pulse 77   Temp 97.7 F (36.5 C) (Oral)   Resp 18   Ht 5\' 8"  (1.727 m)   SpO2 100%   Physical Exam  Constitutional: She is oriented to person, place, and time. She appears well-developed and well-nourished. No distress.  HENT:  Head: Normocephalic and atraumatic.  Eyes: Conjunctivae and EOM are normal.  Neck: Normal range of motion.  Cardiovascular: Normal rate, regular rhythm, normal heart sounds and intact distal pulses.  Exam reveals no gallop and no friction rub.   No murmur heard. Pulmonary/Chest: Effort normal and breath sounds normal. No respiratory distress. She has no wheezes. She has no rales.  Abdominal: Soft. She exhibits no distension. There is no tenderness. There is no guarding.  Genitourinary: Cervix exhibits discharge. Right adnexum displays tenderness. Left adnexum displays tenderness. No bleeding in the vagina. Vaginal discharge found.  Genitourinary Comments: Cervix closed  Musculoskeletal: She exhibits no edema or tenderness.  Neurological: She is alert and oriented to person, place, and time.  Skin:  Skin is warm and dry. No rash noted. She is not diaphoretic. No erythema.  Nursing note and vitals reviewed.    ED Treatments / Results  Labs (all labs ordered are listed, but only abnormal results are displayed) Labs Reviewed  WET PREP, GENITAL - Abnormal; Notable for the following:       Result Value   WBC, Wet Prep HPF POC MANY (*)    All other components within normal limits  HCG, QUANTITATIVE, PREGNANCY - Abnormal; Notable for the following:    hCG, Beta Chain, Quant, S [id number] (*)    All other components within normal limits  CBC - Abnormal; Notable for the following:    HCT 35.9 (*)    All other components within normal limits  GC/CHLAMYDIA PROBE AMP ([place]) NOT AT [facility]    EKG  EKG Interpretation None       Radiology No results found.  Procedures Procedures (including critical care time)  Medications Ordered in ED Medications - No data to display    Initial Impression / Assessment and Plan / ED Course  I have reviewed the triage vital signs and the nursing notes.  Pertinent labs & imaging results that were available during my care of the patient were reviewed by me and considered in my medical decision making (see chart for details).     25yo G2T1001 presents at [redacted]wk gestation with concern for vaginal bleeding. History unclear if vaginal vs rectal bleeding.  Pt hemodynamically stable, hx does not suggest clinically significant GI bleed. Possible internal hemorrhoid if GI source.  Hx of spotting between BM may indicate blood is vaginal. Unclear at this time. Pt without bleeding on pelvic exam, no sign of external hemorrhoids. Cervix closed. Pt A positive blood type. Bedside transabdominal US shows FHR 160s. Notified pt OBGYN on call.  Discussed unclear with pt if rectal or vaginal however do not see signs of vaginal bleeding at this time.  If it was vaginal bleeding, discussed may be threatened miscarriage. Rec pelvic rest, OBGYN follow up, return to ED (rec  [facility]) vaginal bleeding soaking pad/4830min. Patient discharged in stable condition with understanding of reasons to return.   Final Clinical Impressions(s) / ED Diagnoses   Final diagnoses:  Pelvic pain  Diarrhea, unspecified type  Vaginal bleeding, possible vaginal bleeding    New Prescriptions Discharge Medication List as of 02/17/2017  6:47 PM       Alvira MondaySchlossman, Takeo Harts, MD 02/18/17 1321

## 2017-02-20 LAB — GC/CHLAMYDIA PROBE AMP (~~LOC~~) NOT AT ARMC
Chlamydia: NEGATIVE
Neisseria Gonorrhea: NEGATIVE

## 2018-01-08 IMAGING — US US OB TRANSVAGINAL
1 series · 13 of 28 positions shown · non-contrast
Comparison: Pelvic ultrasound performed 07/07/2015

CLINICAL DATA: Acute onset of vaginal bleeding.  Initial encounter.

EXAM:
OBSTETRIC <14 WK US AND TRANSVAGINAL OB US
TECHNIQUE: Both transabdominal and transvaginal ultrasound examinations were
performed for complete evaluation of the gestation as well as the
maternal uterus, adnexal regions, and pelvic cul-de-sac.
Transvaginal technique was performed to assess early pregnancy.

[Series 1: us ob transvaginal · 0.15mm/px · 13 of 93 slices shown]
[im 4/93]
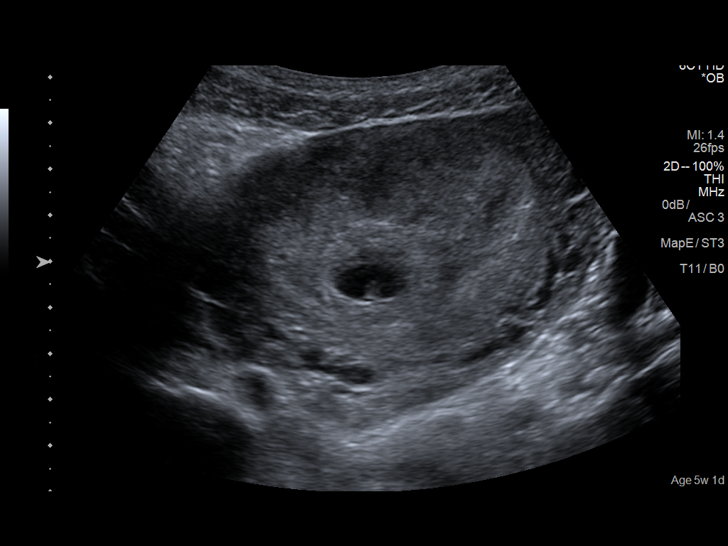
[im 11/93]
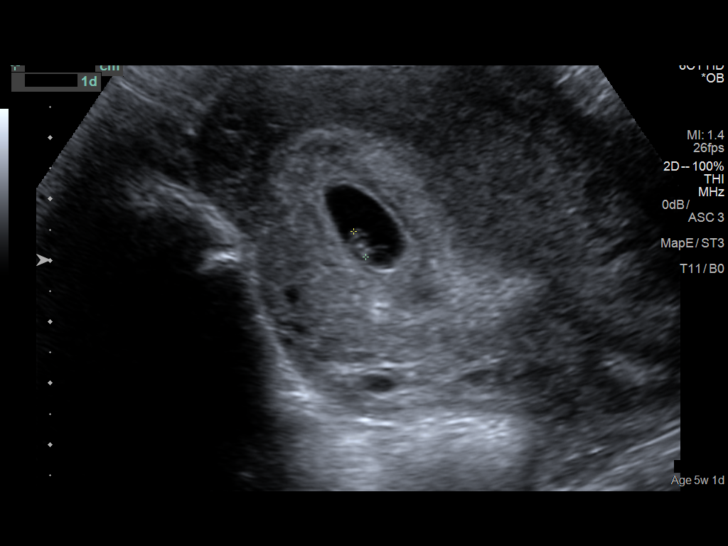
[im 18/93]
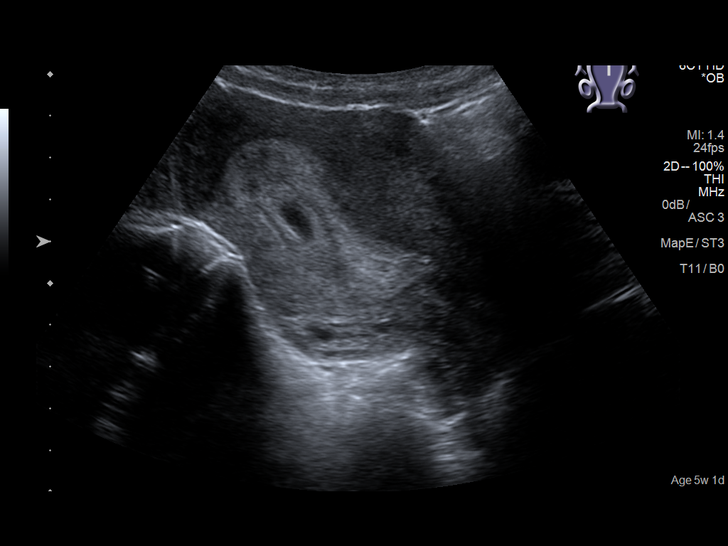
[im 24/93]
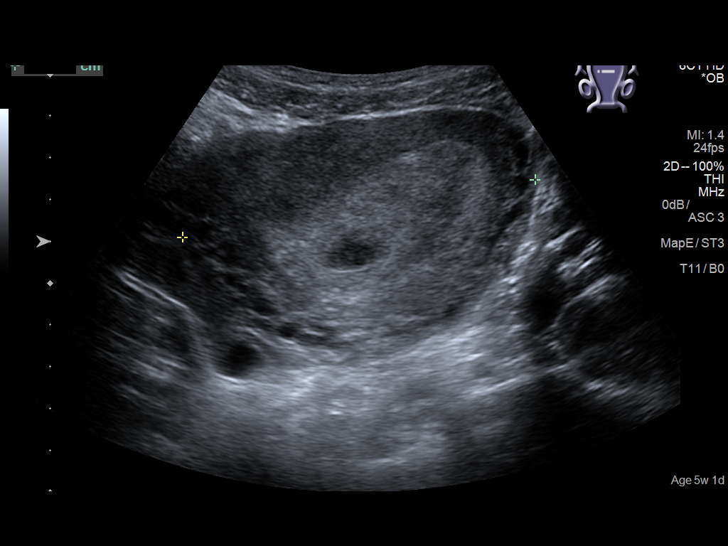
[im 31/93]
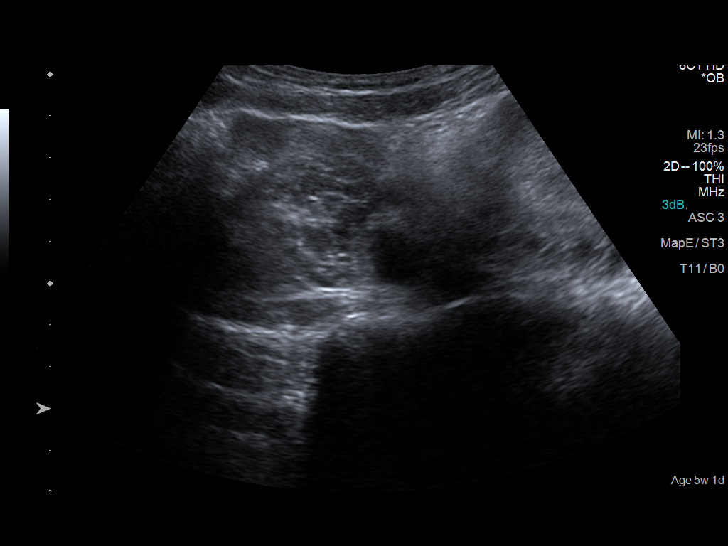
[im 38/93]
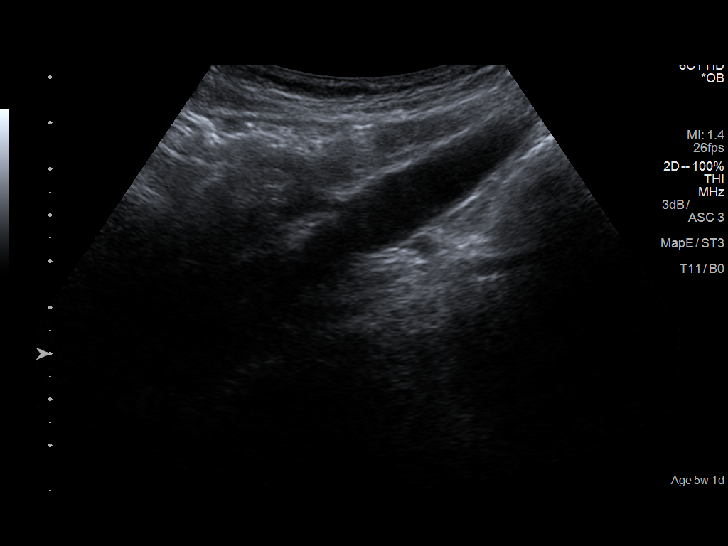
[im 48/93]
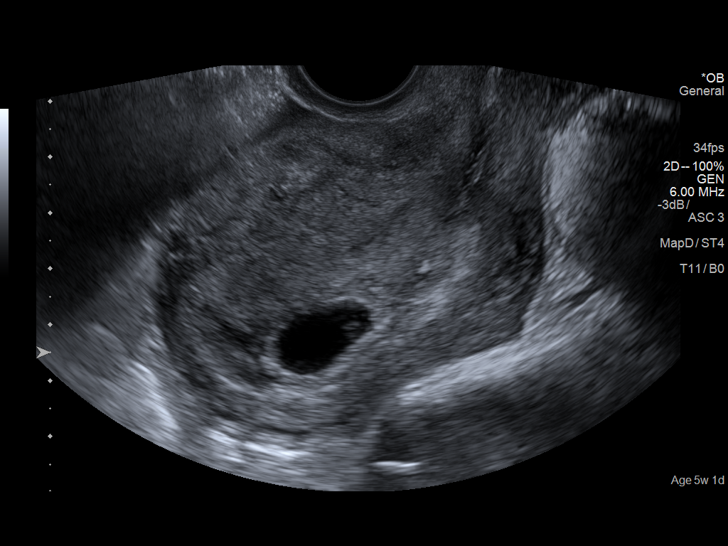
[im 55/93]
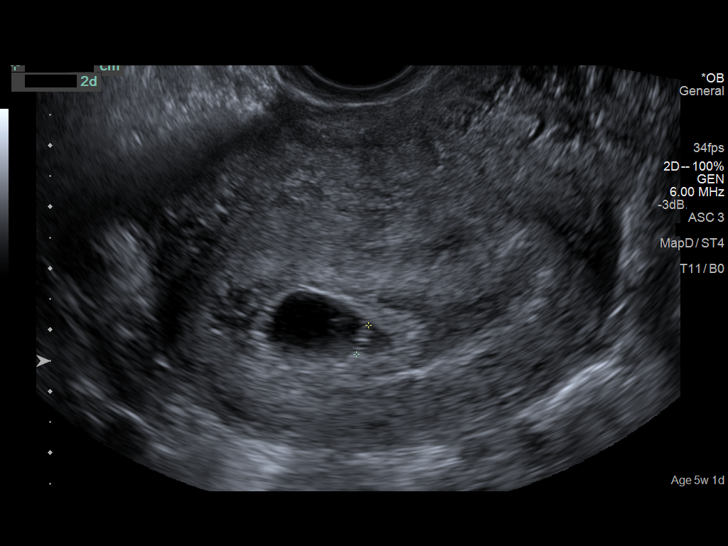
[im 62/93]
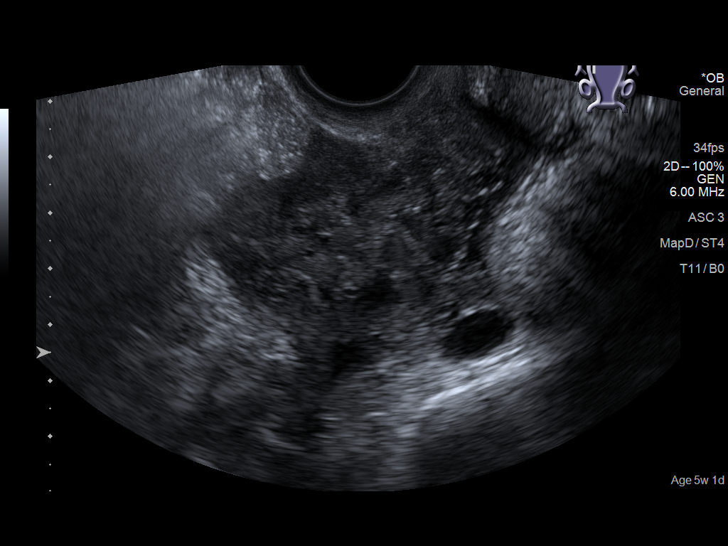
[im 69/93]
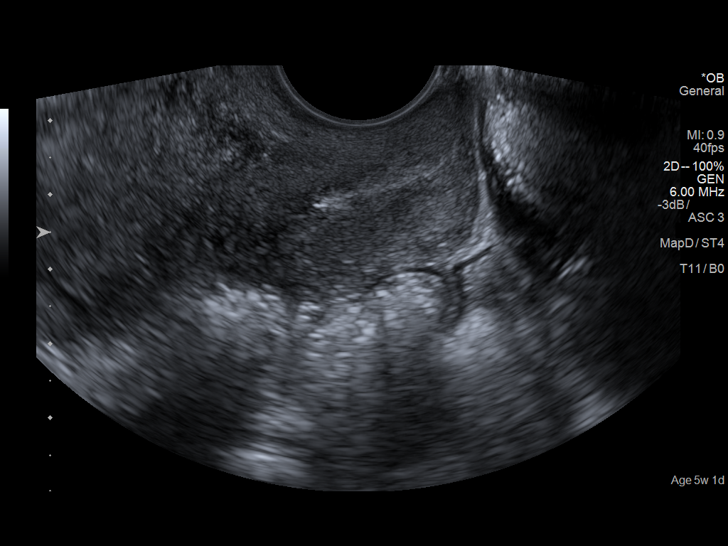
[im 75/93]
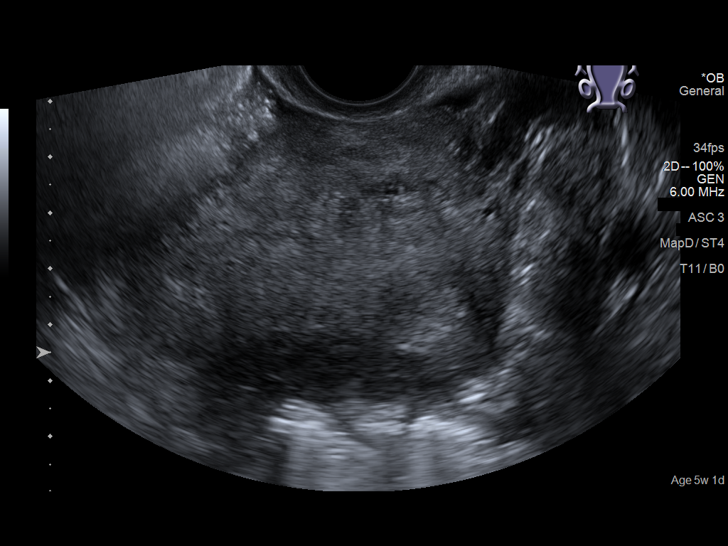
[im 82/93]
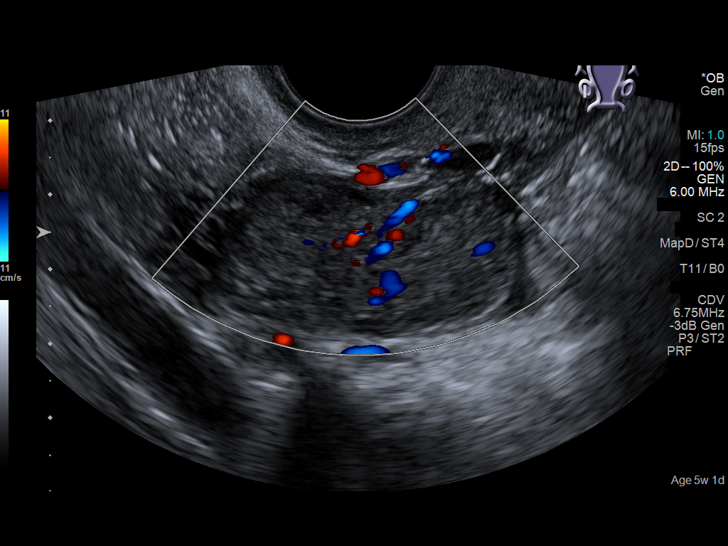
[im 89/93]
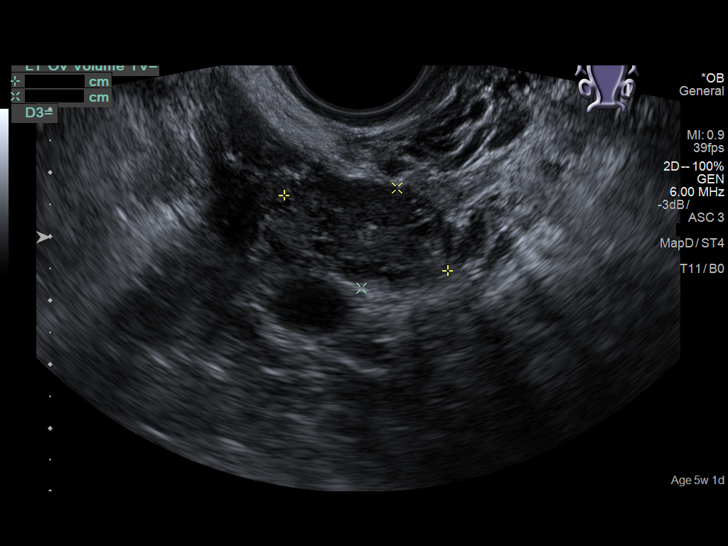

[13 of 28 positions shown; findings below may reference images not displayed]

FINDINGS: Intrauterine gestational sac: Single; visualized and normal in
shape.

Yolk sac:  Yes

Embryo:  Yes

Cardiac Activity: Yes

Heart Rate: 122  bpm

CRL:  5.1  mm   6 w   2 d                  US EDC: 09/09/2017

Subchorionic hemorrhage:  None visualized.

Maternal uterus/adnexae: The uterus is otherwise unremarkable in
appearance.

The ovaries are within normal limits. The right ovary measures 3.9 x
2.0 x 1.9 cm, while the left ovary measures 2.8 x 1.7 x 1.7 cm. No
suspicious adnexal masses are seen; there is no evidence for ovarian
torsion.

No free fluid is seen within the pelvic cul-de-sac.
IMPRESSION: Single live intrauterine pregnancy noted, with a crown-rump length
of 5 mm, corresponding to a gestational age of 6 weeks 2 days. This
does not match the gestational age by LMP, and reflects a new
estimated date of delivery September 09, 2017.

## 2021-12-11 ENCOUNTER — Other Ambulatory Visit: Payer: Self-pay

## 2021-12-11 ENCOUNTER — Emergency Department (HOSPITAL_COMMUNITY)
Admission: EM | Admit: 2021-12-11 | Discharge: 2021-12-11 | Disposition: A | Discharge status: Home / Self Care | Payer: Medicaid Other | Attending: Emergency Medicine | Admitting: Emergency Medicine

## 2021-12-11 DIAGNOSIS — Z20822 Contact with and (suspected) exposure to covid-19: Secondary | ICD-10-CM | POA: Diagnosis not present

## 2021-12-11 DIAGNOSIS — J020 Streptococcal pharyngitis: Secondary | ICD-10-CM | POA: Insufficient documentation

## 2021-12-11 DIAGNOSIS — R059 Cough, unspecified: Secondary | ICD-10-CM | POA: Diagnosis present

## 2021-12-11 DIAGNOSIS — R590 Localized enlarged lymph nodes: Secondary | ICD-10-CM | POA: Diagnosis not present

## 2021-12-11 LAB — RESP PANEL BY RT-PCR (FLU A&B, COVID) ARPGX2
Influenza A by PCR: NEGATIVE
Influenza B by PCR: NEGATIVE
SARS Coronavirus 2 by RT PCR: NEGATIVE

## 2021-12-11 MED ORDER — PENICILLIN G BENZATHINE 1200000 UNIT/2ML IM SUSY
1.2000 10*6.[IU] | PREFILLED_SYRINGE | Freq: Once | INTRAMUSCULAR | Status: AC
Start: 2021-12-11 — End: 2021-12-11
  Administered 2021-12-11: 1.2 10*6.[IU] via INTRAMUSCULAR
  Filled 2021-12-11: qty 2

## 2021-12-11 NOTE — ED Provider Notes (Signed)
?[facility] EMERGENCY DEPARTMENT ?Provider Note ? ? ?CSN: [id number] ?Arrival date & time: 12/11/21  1827 ? ?  ? ?History ? ?Chief Complaint  ?Patient presents with  ? Generalized Body Aches  ? ? ?Nicole Fuller is a 31 y.o. female. ? ?31 year old female who presents with nonproductive cough and URI symptoms.  Denies any vomiting or diarrhea.  Has had a temperature at home up to 101.  Treated with over-the-counter medications.  Endorses sore throat but denies any trouble swallowing.  No chest or abdominal discomfort.  Denies any sick exposures ? ? ?  ? ?Home Medications ?Prior to Admission medications   ?Medication Sig Start Date End Date Taking? Authorizing Provider  ?acetaminophen (TYLENOL) 325 MG tablet Take 2 tablets (650 mg total) by mouth every 4 (four) hours as needed (for pain scale < 4). 01/13/16   Caesar Chestnut, MD  ?Prenatal Vit-Fe Fumarate-FA (PRENATAL COMPLETE) 14-0.4 MG TABS Take 1 tablet by mouth daily. 05/24/15   [name], PA-C  ?terconazole (TERAZOL 3) 0.8 % vaginal cream Place 1 applicator vaginally at bedtime. 01/16/17   Janne Napoleon, NP  ?promethazine (PHENERGAN) 25 MG tablet Take 1 tablet (25 mg total) by mouth every 6 (six) hours as needed for nausea or vomiting. ?Patient not taking: Reported on 07/07/2015 06/13/15 07/07/15  Archie Patten, CNM  ?   ? ?Allergies    ?Promethazine, Aspirin, and Nsaids   ? ?Review of Systems   ?Review of Systems  ?All other systems reviewed and are negative. ? ?Physical Exam ?Updated Vital Signs ?BP 134/83 (BP Location: Right Arm)   Pulse 83   Temp 99.2 ?F (37.3 ?C)   Resp 16   SpO2 100%  ?Physical Exam ?Vitals and nursing note reviewed.  ?Constitutional:   ?   General: She is not in acute distress. ?   Appearance: Normal appearance. She is well-developed. She is not toxic-appearing.  ?HENT:  ?   Head: Normocephalic and atraumatic.  ?   Mouth/Throat:  ?   Pharynx: Oropharyngeal exudate and posterior oropharyngeal erythema present.   ?Eyes:  ?   General: Lids are normal.  ?   Conjunctiva/sclera: Conjunctivae normal.  ?   Pupils: Pupils are equal, round, and reactive to light.  ?Neck:  ?   Thyroid: No thyroid mass.  ?   Trachea: No tracheal deviation.  ?Cardiovascular:  ?   Rate and Rhythm: Normal rate and regular rhythm.  ?   Heart sounds: Normal heart sounds. No murmur heard. ?  No gallop.  ?Pulmonary:  ?   Effort: Pulmonary effort is normal. No respiratory distress.  ?   Breath sounds: Normal breath sounds. No stridor. No decreased breath sounds, wheezing, rhonchi or rales.  ?Abdominal:  ?   General: There is no distension.  ?   Palpations: Abdomen is soft.  ?   Tenderness: There is no abdominal tenderness. There is no rebound.  ?Musculoskeletal:     ?   General: No tenderness. Normal range of motion.  ?   Cervical back: Normal range of motion and neck supple.  ?Lymphadenopathy:  ?   Cervical: Cervical adenopathy present.  ?   Right cervical: Superficial cervical adenopathy present.  ?   Left cervical: Superficial cervical adenopathy present.  ?Skin: ?   General: Skin is warm and dry.  ?   Findings: No abrasion or rash.  ?Neurological:  ?   Mental Status: She is alert and oriented to person, place, and time. Mental status  is at baseline.  ?   GCS: GCS eye subscore is 4. GCS verbal subscore is 5. GCS motor subscore is 6.  ?   Cranial Nerves: No cranial nerve deficit.  ?   Sensory: No sensory deficit.  ?   Motor: Motor function is intact.  ?Psychiatric:     ?   Attention and Perception: Attention normal.     ?   Speech: Speech normal.     ?   Behavior: Behavior normal.  ? ? ?ED Results / Procedures / Treatments   ?Labs ?(all labs ordered are listed, but only abnormal results are displayed) ?Labs Reviewed  ?RESP PANEL BY RT-PCR (FLU A&B, COVID) ARPGX2  ? ? ?EKG ?None ? ?Radiology ?No results found. ? ?Procedures ?Procedures  ? ? ?Medications Ordered in ED ?Medications  ?penicillin g benzathine (BICILLIN LA) 1200000 UNIT/2ML injection 1.2 Million  Units (has no administration in time range)  ? ? ?ED Course/ Medical Decision Making/ A&P ?  ?                        ?Medical Decision Making ?Risk ?Prescription drug management. ? ? ?Patient with clinical evidence of strep pharyngitis.  She has no evidence of peritonsillar abscess.  Her COVID and flu test were negative here.  Offered oral medications and have deferred.  We will treat with Bicillin and return precautions given ? ? ? ? ? ? ? ?Final Clinical Impression(s) / ED Diagnoses ?Final diagnoses:  ?None  ? ? ?Rx / DC Orders ?ED Discharge Orders   ? ? None  ? ?  ? ? ?  ?Lorre Nick, MD ?12/11/21 2239 ? ?

## 2021-12-11 NOTE — ED Provider Triage Note (Signed)
Emergency Medicine Provider Triage Evaluation Note ? ?Nicole Fuller , a 31 y.o. female  was evaluated in triage.  Pt complains of 6 days of cough, poor appetite, headache, sinus drainage and fevers.  She reports generalized body aches is her worst symptom currently.  She denies any specific localized pain worse in one area.  No abdominal pain.  No chest pain or shortness of breath.  She has been taking Tylenol without relief. ? ? ?Physical Exam  ?BP (!) 134/91 (BP Location: Right Arm)   Pulse (!) 118   Temp 99.2 ?F (37.3 ?C) (Oral)   Resp 16   SpO2 100%  ?Gen:   Awake, no distress   ?Resp:  Normal effort  ?MSK:   Moves extremities without difficulty  ?Other:  Normal speech.  ? ?Medical Decision Making  ?Medically screening exam initiated at 7:11 PM.  Appropriate orders placed.  Nicole Fuller was informed that the remainder of the evaluation will be completed by another provider, this initial triage assessment does not replace that evaluation, and the importance of remaining in the ED until their evaluation is complete. ? ?Note while patient's heart rate was listed at 118 when I was in the room evaluating her her heart rate was in the mid to high 90s. ?  ?Cristina Gong, PA-C ?12/11/21 1912 ? ?

## 2021-12-11 NOTE — ED Triage Notes (Signed)
Patient complains of generalized body aches for several days. Also reports poor appetite for six days and constipation for four days. Patient is afebrile, alert, speaking in complete sentences, and in no apparent distress at this time. ?

## 2022-01-02 ENCOUNTER — Ambulatory Visit (HOSPITAL_COMMUNITY)
Admission: EM | Admit: 2022-01-02 | Discharge: 2022-01-02 | Disposition: A | Discharge status: Home / Self Care | Payer: Medicaid Other | Attending: Emergency Medicine | Admitting: Emergency Medicine

## 2022-01-02 ENCOUNTER — Encounter (HOSPITAL_COMMUNITY): Payer: Self-pay

## 2022-01-02 DIAGNOSIS — H66001 Acute suppurative otitis media without spontaneous rupture of ear drum, right ear: Secondary | ICD-10-CM | POA: Diagnosis not present

## 2022-01-02 MED ORDER — AMOXICILLIN-POT CLAVULANATE 875-125 MG PO TABS: 1.0000 | ORAL_TABLET | Freq: Two times a day (BID) | ORAL | 0 refills | Status: DC

## 2022-01-02 NOTE — Discharge Instructions (Addendum)
Take antibiotics as prescribed. Continue with OTC medicine for discomfort. Return to care if no improvement after completing antibiotics.  ?

## 2022-01-02 NOTE — ED Provider Notes (Signed)
?[facility] ? ? ? ?CSN: [id number] ?Arrival date & time: 01/02/22  1811 ? ? ?  ? ?History   ?Chief Complaint ?Chief Complaint  ?Patient presents with  ? Otalgia  ? Headache  ? ? ?HPI ?Nicole Fuller is a 31 y.o. female.  ? ?Patient presents with concerns of possible ear infection. She reports right ear discomfort for the past 3 days, gradually worsening. She reports the last day she has also developed headache and discomfort into her neck. She has felt feverish but not taken her temperature. She has tried OTC pain reliever and ear drops without improvement. She denies congestion, sore throat, cough, dizziness, or hearing change but states some tones make her ear ring. She denies discharge from the ear.  ? ?The history is provided by the patient.  ?Otalgia ?Associated symptoms: headaches   ?Associated symptoms: no congestion, no cough, no ear discharge, no hearing loss, no rash and no sore throat   ?Headache ?Associated symptoms: ear pain   ?Associated symptoms: no congestion, no cough, no dizziness, no fatigue, no hearing loss, no nausea and no sore throat   ? ?Past Medical History:  ?Diagnosis Date  ? Medical history non-contributory   ? ? ?Patient Active Problem List  ? Diagnosis Date Noted  ? Active labor 01/11/2016  ? Labor and delivery, indication for care 01/11/2016  ? Rubella non-immune status, antepartum 01/11/2016  ? ? ?Past Surgical History:  ?Procedure Laterality Date  ? NO PAST SURGERIES    ? ? ?OB History   ? ? Gravida  ?1  ? Para  ?1  ? Term  ?1  ? Preterm  ?   ? AB  ?   ? Living  ?1  ?  ? ? SAB  ?   ? IAB  ?   ? Ectopic  ?   ? Multiple  ?0  ? Live Births  ?1  ?   ?  ?  ? ? ? ?Home Medications   ? ?Prior to Admission medications   ?Medication Sig Start Date End Date Taking? Authorizing Provider  ?amoxicillin-clavulanate (AUGMENTIN) 875-125 MG tablet Take 1 tablet by mouth every 12 (twelve) hours. 01/02/22  Yes Skyy Mcknight L, PA  ?acetaminophen (TYLENOL) 325 MG tablet Take 2 tablets (650 mg  total) by mouth every 4 (four) hours as needed (for pain scale < 4). 01/13/16   Caesar Chestnut, MD  ?Prenatal Vit-Fe Fumarate-FA (PRENATAL COMPLETE) 14-0.4 MG TABS Take 1 tablet by mouth daily. 05/24/15   [name], PA-C  ?terconazole (TERAZOL 3) 0.8 % vaginal cream Place 1 applicator vaginally at bedtime. 01/16/17   Janne Napoleon, NP  ?promethazine (PHENERGAN) 25 MG tablet Take 1 tablet (25 mg total) by mouth every 6 (six) hours as needed for nausea or vomiting. ?Patient not taking: Reported on 07/07/2015 06/13/15 07/07/15  Archie Patten, CNM  ? ? ?Family History ?Family History  ?Problem Relation Age of Onset  ? Cancer Mother   ? Diabetes Maternal Uncle   ? Diabetes Maternal Grandmother   ? ? ?Social History ?Social History  ? ?Tobacco Use  ? Smoking status: Never  ? Smokeless tobacco: Never  ?Substance Use Topics  ? Alcohol use: No  ? Drug use: No  ? ? ? ?Allergies   ?Promethazine, Aspirin, and Nsaids ? ? ?Review of Systems ?Review of Systems  ?Constitutional:  Positive for chills. Negative for fatigue.  ?HENT:  Positive for ear pain. Negative for congestion, ear discharge, hearing loss and  sore throat.   ?Respiratory:  Negative for cough and shortness of breath.   ?Gastrointestinal:  Negative for nausea.  ?Skin:  Negative for rash.  ?Neurological:  Positive for headaches. Negative for dizziness.  ? ? ?Physical Exam ?Triage Vital Signs ?ED Triage Vitals  ?Enc Vitals Group  ?   BP 01/02/22 1853 113/75  ?   Pulse Rate 01/02/22 1853 94  ?   Resp 01/02/22 1853 18  ?   Temp 01/02/22 1853 99.1 ?F (37.3 ?C)  ?   Temp Source 01/02/22 1853 Oral  ?   SpO2 01/02/22 1853 100 %  ?   Weight --   ?   Height --   ?   Head Circumference --   ?   Peak Flow --   ?   Pain Score 01/02/22 1852 7  ?   Pain Loc --   ?   Pain Edu? --   ?   Excl. in GC? --   ? ?No data found. ? ?Updated Vital Signs ?BP 113/75 (BP Location: Right Arm)   Pulse 94   Temp 99.1 ?F (37.3 ?C) (Oral)   Resp 18   LMP 12/29/2021   SpO2 100%    Breastfeeding No  ? ?Visual Acuity ?Right Eye Distance:   ?Left Eye Distance:   ?Bilateral Distance:   ? ?Right Eye Near:   ?Left Eye Near:    ?Bilateral Near:    ? ?Physical Exam ?Vitals and nursing note reviewed.  ?Constitutional:   ?   General: She is not in acute distress. ?HENT:  ?   Head: Normocephalic.  ?   Right Ear: Ear canal and external ear normal. No drainage, swelling or tenderness. Tympanic membrane is erythematous and bulging.  ?   Left Ear: Tympanic membrane, ear canal and external ear normal.  ?   Nose: Nose normal.  ?   Mouth/Throat:  ?   Mouth: Mucous membranes are moist.  ?   Pharynx: Oropharynx is clear.  ?Eyes:  ?   Pupils: Pupils are equal, round, and reactive to light.  ?Cardiovascular:  ?   Rate and Rhythm: Normal rate and regular rhythm.  ?   Heart sounds: Normal heart sounds.  ?Pulmonary:  ?   Effort: Pulmonary effort is normal.  ?   Breath sounds: Normal breath sounds.  ?Lymphadenopathy:  ?   Cervical: Cervical adenopathy present.  ?   Right cervical: Superficial cervical adenopathy present.  ?   Left cervical: No superficial cervical adenopathy.  ?Skin: ?   Findings: No rash.  ?Neurological:  ?   Mental Status: She is alert.  ?Psychiatric:     ?   Mood and Affect: Mood normal.  ? ? ? ?UC Treatments / Results  ?Labs ?(all labs ordered are listed, but only abnormal results are displayed) ?Labs Reviewed - No data to display ? ?EKG ? ? ?Radiology ?No results found. ? ?Procedures ?Procedures (including critical care time) ? ?Medications Ordered in UC ?Medications - No data to display ? ?Initial Impression / Assessment and Plan / UC Course  ?I have reviewed the triage vital signs and the nursing notes. ? ?Pertinent labs & imaging results that were available during my care of the patient were reviewed by me and considered in my medical decision making (see chart for details). ? ?  ? ?AOM, empiric Augmentin.  ? ?E/M: 1 acute uncomplicated illness, no data, moderate risk due to prescription  management ? ?Final Clinical Impressions(s) / UC Diagnoses  ? ?  Final diagnoses:  ?Non-recurrent acute suppurative otitis media of right ear without spontaneous rupture of tympanic membrane  ? ? ? ?Discharge Instructions   ? ?  ?Take antibiotics as prescribed. Continue with OTC medicine for discomfort. Return to care if no improvement after completing antibiotics.  ? ? ? ?ED Prescriptions   ? ? Medication Sig Dispense Auth. Provider  ? amoxicillin-clavulanate (AUGMENTIN) 875-125 MG tablet Take 1 tablet by mouth every 12 (twelve) hours. 14 tablet Vallery Sa, Adalai Perl L, PA  ? ?  ? ?PDMP not reviewed this encounter. ?  Estanislado Pandy, [place] ?01/02/22 2003 ? ?

## 2022-01-02 NOTE — ED Triage Notes (Signed)
Pt presents with right ear pain and head pain X 3 days. ?

## 2022-04-03 ENCOUNTER — Encounter (HOSPITAL_COMMUNITY): Payer: Self-pay

## 2022-04-03 ENCOUNTER — Ambulatory Visit (HOSPITAL_COMMUNITY)
Admission: EM | Admit: 2022-04-03 | Discharge: 2022-04-03 | Disposition: A | Discharge status: Home / Self Care | Payer: Medicaid Other | Attending: Family Medicine | Admitting: Family Medicine

## 2022-04-03 DIAGNOSIS — N6321 Unspecified lump in the left breast, upper outer quadrant: Secondary | ICD-10-CM

## 2022-04-03 DIAGNOSIS — Z3202 Encounter for pregnancy test, result negative: Secondary | ICD-10-CM

## 2022-04-03 DIAGNOSIS — N644 Mastodynia: Secondary | ICD-10-CM

## 2022-04-03 LAB — POC URINE PREG, ED: Preg Test, Ur: NEGATIVE

## 2022-04-03 NOTE — ED Provider Notes (Signed)
[facility]    CSN: [id number] Arrival date & time: 04/03/22  4401      History   Chief Complaint Chief Complaint  Patient presents with   Breast Mass    HPI Nicole Fuller is a 31 y.o. female.   HPI Here for a lump in her left breast that she noted about a week ago.  Then yesterday it became painful.  No fever or chills or vomiting.  Last menstrual cycle was last month.  She does have an IUD.  There is a family history of breast cancer.  Also has felt that her breasts have increased in volume a little bit in the last 2 or 3 months  Past Medical History:  Diagnosis Date   Medical history non-contributory     Patient Active Problem List   Diagnosis Date Noted   Active labor 01/11/2016   Labor and delivery, indication for care 01/11/2016   Rubella non-immune status, antepartum 01/11/2016    Past Surgical History:  Procedure Laterality Date   NO PAST SURGERIES      OB History     Gravida  1   Para  1   Term  1   Preterm      AB      Living  1      SAB      IAB      Ectopic      Multiple  0   Live Births  1            Home Medications    Prior to Admission medications   Medication Sig Start Date End Date Taking? Authorizing Provider  promethazine (PHENERGAN) 25 MG tablet Take 1 tablet (25 mg total) by mouth every 6 (six) hours as needed for nausea or vomiting. Patient not taking: Reported on 07/07/2015 06/13/15 07/07/15  Archie Patten, CNM    Family History Family History  Problem Relation Age of Onset   Cancer Mother    Diabetes Maternal Uncle    Diabetes Maternal Grandmother     Social History Social History   Tobacco Use   Smoking status: Never   Smokeless tobacco: Never  Substance Use Topics   Alcohol use: No   Drug use: No     Allergies   Promethazine, Aspirin, and Nsaids   Review of Systems Review of Systems   Physical Exam Triage Vital Signs ED Triage Vitals  Enc Vitals Group     BP  04/03/22 0856 (!) 140/103     Pulse Rate 04/03/22 0856 87     Resp 04/03/22 0856 14     Temp 04/03/22 0856 97.9 F (36.6 C)     Temp Source 04/03/22 0856 Oral     SpO2 04/03/22 0856 100 %     Weight --      Height --      Head Circumference --      Peak Flow --      Pain Score 04/03/22 0854 4     Pain Loc --      Pain Edu? --      Excl. in GC? --    No data found.  Updated Vital Signs BP (!) 140/103 (BP Location: Left Arm)   Pulse 87   Temp 97.9 F (36.6 C) (Oral)   Resp 14   SpO2 100%   Visual Acuity Right Eye Distance:   Left Eye Distance:   Bilateral Distance:    Right Eye Near:  Left Eye Near:    Bilateral Near:     Physical Exam Vitals reviewed.  Constitutional:      General: She is not in acute distress.    Appearance: She is not ill-appearing, toxic-appearing or diaphoretic.  Eyes:     Extraocular Movements: Extraocular movements intact.     Pupils: Pupils are equal, round, and reactive to light.  Cardiovascular:     Rate and Rhythm: Normal rate and regular rhythm.  Pulmonary:     Effort: Pulmonary effort is normal.     Breath sounds: Normal breath sounds.  Chest:     Comments: There is an area of tenderness in her upper outer quadrant of her left breast.  It is actually at the tail of the breast tissue.  I cannot palpate any indurated skin, but there is a possible nodule there.  There is no hard mass. Neurological:     Mental Status: She is alert.      UC Treatments / Results  Labs (all labs ordered are listed, but only abnormal results are displayed) Labs Reviewed  POC URINE PREG, ED    EKG   Radiology No results found.  Procedures Procedures (including critical care time)  Medications Ordered in UC Medications - No data to display  Initial Impression / Assessment and Plan / UC Course  I have reviewed the triage vital signs and the nursing notes.  Pertinent labs & imaging results that were available during my care of the patient  were reviewed by me and considered in my medical decision making (see chart for details).     Though I suspect this is a cyst, mammogram and ultrasound are ordered.  She is given the contact information to schedule the appointment.  UPT is negative, and that is done to make sure that is not why her breasts have increased in volume Final Clinical Impressions(s) / UC Diagnoses   Final diagnoses:  Mass of upper outer quadrant of left breast     Discharge Instructions      Please call the [facility] Center of [place] [facility] at [phone], to schedule your mammogram.  The orders been placed in our computer system     ED Prescriptions   None    I have reviewed the PDMP during this encounter.   Zenia Resides, MD 04/03/22 [phone]

## 2022-04-03 NOTE — ED Triage Notes (Signed)
Pt presents with c/o painful left breast mass x 1 week. Pt also endorses her cup size has also increased in bilateral breasts within the last month. Pt states she has an IUD that expires 10/2022

## 2022-04-03 NOTE — Discharge Instructions (Addendum)
Please call the breast Center of  imaging at [phone], to schedule your mammogram.  The orders been placed in our computer system

## 2022-04-13 ENCOUNTER — Ambulatory Visit
Admission: RE | Admit: 2022-04-13 | Discharge: 2022-04-13 | Disposition: A | Discharge status: Home / Self Care | Payer: Medicaid Other | Source: Ambulatory Visit | Attending: Family Medicine | Admitting: Family Medicine

## 2022-04-13 ENCOUNTER — Other Ambulatory Visit (HOSPITAL_COMMUNITY): Payer: Self-pay | Admitting: Family Medicine

## 2022-04-13 DIAGNOSIS — N6320 Unspecified lump in the left breast, unspecified quadrant: Secondary | ICD-10-CM

## 2022-04-13 DIAGNOSIS — N644 Mastodynia: Secondary | ICD-10-CM

## 2022-08-12 ENCOUNTER — Encounter (HOSPITAL_BASED_OUTPATIENT_CLINIC_OR_DEPARTMENT_OTHER): Payer: Self-pay | Admitting: Emergency Medicine

## 2022-08-12 ENCOUNTER — Emergency Department (HOSPITAL_BASED_OUTPATIENT_CLINIC_OR_DEPARTMENT_OTHER)
Admission: EM | Admit: 2022-08-12 | Discharge: 2022-08-13 | Disposition: A | Discharge status: Home / Self Care | Payer: Medicaid Other | Attending: Emergency Medicine | Admitting: Emergency Medicine

## 2022-08-12 ENCOUNTER — Other Ambulatory Visit: Payer: Self-pay

## 2022-08-12 DIAGNOSIS — G629 Polyneuropathy, unspecified: Secondary | ICD-10-CM

## 2022-08-12 DIAGNOSIS — N83202 Unspecified ovarian cyst, left side: Secondary | ICD-10-CM | POA: Insufficient documentation

## 2022-08-12 DIAGNOSIS — R202 Paresthesia of skin: Secondary | ICD-10-CM | POA: Diagnosis present

## 2022-08-12 DIAGNOSIS — N949 Unspecified condition associated with female genital organs and menstrual cycle: Secondary | ICD-10-CM | POA: Diagnosis not present

## 2022-08-12 LAB — CBC WITH DIFFERENTIAL/PLATELET
Abs Immature Granulocytes: 0.02 10*3/uL (ref 0.00–0.07)
Basophils Absolute: 0 10*3/uL (ref 0.0–0.1)
Basophils Relative: 0 %
Eosinophils Absolute: 0.1 10*3/uL (ref 0.0–0.5)
Eosinophils Relative: 1 %
HCT: 39 % (ref 36.0–46.0)
Hemoglobin: 13.4 g/dL (ref 12.0–15.0)
Immature Granulocytes: 0 %
Lymphocytes Relative: 38 %
Lymphs Abs: 2.7 10*3/uL (ref 0.7–4.0)
MCH: 32.1 pg (ref 26.0–34.0)
MCHC: 34.4 g/dL (ref 30.0–36.0)
MCV: 93.3 fL (ref 80.0–100.0)
Monocytes Absolute: 0.4 10*3/uL (ref 0.1–1.0)
Monocytes Relative: 6 %
Neutro Abs: 3.8 10*3/uL (ref 1.7–7.7)
Neutrophils Relative %: 55 %
Platelets: 182 10*3/uL (ref 150–400)
RBC: 4.18 MIL/uL (ref 3.87–5.11)
RDW: 11.7 % (ref 11.5–15.5)
WBC: 7.1 10*3/uL (ref 4.0–10.5)
nRBC: 0 % (ref 0.0–0.2)

## 2022-08-12 LAB — PREGNANCY, URINE: Preg Test, Ur: NEGATIVE

## 2022-08-12 NOTE — ED Provider Notes (Signed)
[facility] Provider Note: Lowella Dell, MD, FACEP  CSN: [id number] MRN: [id number] ARRIVAL: 08/12/22 at 2212 ROOM: MH01/[facility]01   CHIEF COMPLAINT  Numbness   HISTORY OF PRESENT ILLNESS  08/12/22 10:55 PM Nicole Fuller is a 31 y.o. female who has had partial numbness and paresthesias of her left lower leg for about the past month.  The location of this altered sensation is in the lateral aspect of the left lower leg and the medial aspect of the dorsal left foot.  She has also noticed over the last week a mild partial left foot drop.  She is able to dorsiflex her left foot but not with 100% strength and she feels her left foot tapping on the floor when she walks.  She is also unable to fully plantarflex her left third, fourth and fifth toes.  She is having no pain with this.   History reviewed. No pertinent past medical history.   Past Surgical History:  Procedure Laterality Date   NO PAST SURGERIES      Family History  Problem Relation Age of Onset   Breast cancer Mother    Cancer Mother    Diabetes Maternal Uncle    Diabetes Maternal Grandmother     Social History   Tobacco Use   Smoking status: Never   Smokeless tobacco: Never  Vaping Use   Vaping Use: Never used  Substance Use Topics   Alcohol use: No   Drug use: No    Prior to Admission medications   Medication Sig Start Date End Date Taking? Authorizing Provider  promethazine (PHENERGAN) 25 MG tablet Take 1 tablet (25 mg total) by mouth every 6 (six) hours as needed for nausea or vomiting. Patient not taking: Reported on 07/07/2015 06/13/15 07/07/15  Archie Patten, CNM    Allergies Promethazine, Aspirin, and Nsaids   REVIEW OF SYSTEMS  Negative except as noted here or in the History of Present Illness.   PHYSICAL EXAMINATION  Initial Vital Signs Blood pressure (!) 140/98, pulse 92, temperature 98.1 F (36.7 C), temperature source Oral, resp. rate 14, height 5\' 8"  (1.727 m), weight  54.4 kg, SpO2 100 %.  Examination General: Well-developed, well-nourished female in no acute distress; appearance consistent with age of record HENT: normocephalic; atraumatic Eyes: Normal appearance Neck: supple Heart: regular rate and rhythm Lungs: clear to auscultation bilaterally Abdomen: soft; nondistended; nontender; bowel sounds present Extremities: No deformity; full range of motion; pulses normal Neurologic: Awake, alert and oriented; altered sensation in the lateral aspect of the left lower leg and the medial dorsal left foot, mild weakness of dorsiflexion and plantarflexion of left ankle, limited flexion of left third, fourth and fifth toes Skin: Warm and dry Psychiatric: Normal mood and affect   RESULTS  Summary of this visit's results, reviewed and interpreted by myself:   EKG Interpretation  Date/Time:    Ventricular Rate:    PR Interval:    QRS Duration:   QT Interval:    QTC Calculation:   R Axis:     Text Interpretation:         Laboratory Studies: Results for orders placed or performed during the hospital encounter of 08/12/22 (from the past 24 hour(s))  CBC with Differential     Status: None   Collection Time: 08/12/22 11:40 PM  Result Value Ref Range   WBC 7.1 4.0 - 10.5 K/uL   RBC 4.18 3.87 - 5.11 MIL/uL   Hemoglobin 13.4 12.0 - 15.0 g/dL  HCT 39.0 36.0 - 46.0 %   MCV 93.3 80.0 - 100.0 fL   MCH 32.1 26.0 - 34.0 pg   MCHC 34.4 30.0 - 36.0 g/dL   RDW 70.3 50.0 - 93.8 %   Platelets 182 150 - 400 K/uL   nRBC 0.0 0.0 - 0.2 %   Neutrophils Relative % 55 %   Neutro Abs 3.8 1.7 - 7.7 K/uL   Lymphocytes Relative 38 %   Lymphs Abs 2.7 0.7 - 4.0 K/uL   Monocytes Relative 6 %   Monocytes Absolute 0.4 0.1 - 1.0 K/uL   Eosinophils Relative 1 %   Eosinophils Absolute 0.1 0.0 - 0.5 K/uL   Basophils Relative 0 %   Basophils Absolute 0.0 0.0 - 0.1 K/uL   Immature Granulocytes 0 %   Abs Immature Granulocytes 0.02 0.00 - 0.07 K/uL  Comprehensive metabolic  panel     Status: Abnormal   Collection Time: 08/12/22 11:40 PM  Result Value Ref Range   Sodium 139 135 - 145 mmol/L   Potassium 3.3 (L) 3.5 - 5.1 mmol/L   Chloride 106 98 - 111 mmol/L   CO2 27 22 - 32 mmol/L   Glucose, Bld 80 70 - 99 mg/dL   BUN 10 6 - 20 mg/dL   Creatinine, Ser 1.82 0.44 - 1.00 mg/dL   Calcium 8.9 8.9 - 99.3 mg/dL   Total Protein 7.5 6.5 - 8.1 g/dL   Albumin 4.1 3.5 - 5.0 g/dL   AST 18 15 - 41 U/L   ALT 9 0 - 44 U/L   Alkaline Phosphatase 53 38 - 126 U/L   Total Bilirubin 0.5 0.3 - 1.2 mg/dL   GFR, Estimated >71 >69 mL/min   Anion gap 6 5 - 15  Pregnancy, urine     Status: None   Collection Time: 08/12/22 11:48 PM  Result Value Ref Range   Preg Test, Ur NEGATIVE NEGATIVE   Imaging Studies: CT ABDOMEN PELVIS W CONTRAST  Result Date: 08/13/2022 CLINICAL DATA:  Lumbar pain. EXAM: CT ABDOMEN AND PELVIS WITH CONTRAST TECHNIQUE: Multidetector CT imaging of the abdomen and pelvis was performed using the standard protocol following bolus administration of intravenous contrast. RADIATION DOSE REDUCTION: This exam was performed according to the departmental dose-optimization program which includes automated exposure control, adjustment of the mA and/or kV according to patient size and/or use of iterative reconstruction technique. CONTRAST:  OMNIPAQUE IOHEXOL 300 MG/ML  SOLN COMPARISON:  None Available. FINDINGS: Lower chest: No acute abnormality. Hepatobiliary: No focal liver abnormality is seen. No gallstones, gallbladder wall thickening, or biliary dilatation. Pancreas: Unremarkable. No pancreatic ductal dilatation or surrounding inflammatory changes. Spleen: Normal in size without focal abnormality. Adrenals/Urinary Tract: Adrenal glands are unremarkable. Kidneys are normal, without renal calculi, focal lesion, or hydronephrosis. Bladder is unremarkable. Stomach/Bowel: Stomach is within normal limits. Appendix appears normal. No evidence of bowel wall thickening,  distention, or inflammatory changes. Vascular/Lymphatic: No significant vascular findings are present. No enlarged abdominal or pelvic lymph nodes. Reproductive: Rounded left adnexal cyst measures 3.8 cm. IUD is seen in the uterus. Right ovary not well delineated. Other: Trace free fluid in the pelvis. No focal abdominal wall hernia. Musculoskeletal: No acute or significant osseous findings. IMPRESSION: 1. 3.8 cm left adnexal cyst. Recommend follow-up pelvic ultrasound in 6-8 weeks. 2. Trace free fluid in the pelvis, likely physiologic. 3. No other acute localizing process in the abdomen or pelvis. Electronically Signed   By: Darliss Cheney M.D.   On: 08/13/2022 00:41  ED COURSE and MDM  Nursing notes, initial and subsequent vitals signs, including pulse oximetry, reviewed and interpreted by myself.  Vitals:   08/12/22 2221 08/12/22 2232  BP: (!) 140/98   Pulse: 92   Resp: 14   Temp: 98.1 F (36.7 C)   TempSrc: Oral   SpO2: 100%   Weight:  54.4 kg  Height:  5\' 8"  (1.727 m)   Medications  iohexol (OMNIPAQUE) 300 MG/ML solution 100 mL (100 mLs Intravenous Contrast Given 08/13/22 0033)    11:30 PM Discussed with Dr. 08/15/22 of neurology.  She thinks that we should start with some basic lab work and a CT of the abdomen and pelvis to evaluate for possible tumor compressing the lumbar plexus.  If this is unremarkable she recommends outpatient follow-up with neurology.  1:12 AM Adnexal cyst seen on CT scan but no other tumors or findings that would explain the patient's peripheral neuropathy.  We will refer to OB/GYN for follow-up of the adnexal cyst, and will refer to neurology for follow-up of the neuropathy.  PROCEDURES  Procedures   ED DIAGNOSES     ICD-10-CM   1. Neuropathy  G62.9     2. Adnexal cyst  N94.9          Talor Cheema, Iver Nestle, MD 08/13/22 [id number]

## 2022-08-12 NOTE — ED Triage Notes (Signed)
Patient arrived via POV c/o extremity numbness in lower left leg. Patient states feeling is starting to cause instability and loss of control of left foot. Patient denies pain. Patient is AO x 4, VS WDL, slow gait.

## 2022-08-13 ENCOUNTER — Emergency Department (HOSPITAL_BASED_OUTPATIENT_CLINIC_OR_DEPARTMENT_OTHER): Payer: Medicaid Other

## 2022-08-13 ENCOUNTER — Encounter (HOSPITAL_BASED_OUTPATIENT_CLINIC_OR_DEPARTMENT_OTHER): Payer: Self-pay

## 2022-08-13 LAB — COMPREHENSIVE METABOLIC PANEL
ALT: 9 U/L (ref 0–44)
AST: 18 U/L (ref 15–41)
Albumin: 4.1 g/dL (ref 3.5–5.0)
Alkaline Phosphatase: 53 U/L (ref 38–126)
Anion gap: 6 (ref 5–15)
BUN: 10 mg/dL (ref 6–20)
CO2: 27 mmol/L (ref 22–32)
Calcium: 8.9 mg/dL (ref 8.9–10.3)
Chloride: 106 mmol/L (ref 98–111)
Creatinine, Ser: 0.73 mg/dL (ref 0.44–1.00)
GFR, Estimated: >60 mL/min (ref >60)
Glucose, Bld: 80 mg/dL (ref 70–99)
Potassium: 3.3 mmol/L — ABNORMAL LOW (ref 3.5–5.1)
Sodium: 139 mmol/L (ref 135–145)
Total Bilirubin: 0.5 mg/dL (ref 0.3–1.2)
Total Protein: 7.5 g/dL (ref 6.5–8.1)

## 2022-08-13 MED ORDER — IOHEXOL 300 MG/ML  SOLN
100.0000 mL | Freq: Once | INTRAMUSCULAR | Status: AC | PRN
  Administered 2022-08-13: 100 mL via INTRAVENOUS

## 2023-01-01 NOTE — Telephone Encounter (Signed)
NA

## 2023-06-25 ENCOUNTER — Other Ambulatory Visit: Payer: Self-pay | Admitting: Family Medicine

## 2023-06-25 DIAGNOSIS — Z1231 Encounter for screening mammogram for malignant neoplasm of breast: Secondary | ICD-10-CM

## 2023-08-01 ENCOUNTER — Ambulatory Visit: Payer: Medicaid Other

## 2023-08-21 ENCOUNTER — Ambulatory Visit: Payer: Medicaid Other

## 2023-08-22 ENCOUNTER — Ambulatory Visit
Admission: RE | Admit: 2023-08-22 | Discharge: 2023-08-22 | Disposition: A | Discharge status: Home / Self Care | Payer: Medicaid Other | Source: Ambulatory Visit | Attending: Family Medicine | Admitting: Family Medicine

## 2023-08-22 DIAGNOSIS — Z1231 Encounter for screening mammogram for malignant neoplasm of breast: Secondary | ICD-10-CM

## 2023-10-29 ENCOUNTER — Encounter (HOSPITAL_COMMUNITY): Payer: Self-pay

## 2023-10-29 ENCOUNTER — Ambulatory Visit (HOSPITAL_COMMUNITY)
Admission: EM | Admit: 2023-10-29 | Discharge: 2023-10-29 | Disposition: A | Discharge status: Home / Self Care | Payer: Medicaid Other | Attending: Emergency Medicine | Admitting: Emergency Medicine

## 2023-10-29 ENCOUNTER — Ambulatory Visit (INDEPENDENT_AMBULATORY_CARE_PROVIDER_SITE_OTHER): Payer: Self-pay

## 2023-10-29 DIAGNOSIS — R0602 Shortness of breath: Secondary | ICD-10-CM

## 2023-10-29 DIAGNOSIS — B349 Viral infection, unspecified: Secondary | ICD-10-CM

## 2023-10-29 MED ORDER — ALBUTEROL SULFATE HFA 108 (90 BASE) MCG/ACT IN AERS: 2.0000 | INHALATION_SPRAY | Freq: Four times a day (QID) | RESPIRATORY_TRACT | 1 refills | Status: AC | PRN

## 2023-10-29 MED ORDER — BENZONATATE 100 MG PO CAPS: 100.0000 mg | ORAL_CAPSULE | Freq: Three times a day (TID) | ORAL | 0 refills | Status: AC | PRN

## 2023-10-29 MED ORDER — ACETAMINOPHEN 325 MG PO TABS
ORAL_TABLET | ORAL | Status: AC
  Filled 2023-10-29: qty 2

## 2023-10-29 MED ORDER — ACETAMINOPHEN 325 MG PO TABS
650.0000 mg | ORAL_TABLET | Freq: Once | ORAL | Status: AC
  Administered 2023-10-29: 650 mg via ORAL

## 2023-10-29 NOTE — ED Provider Notes (Signed)
[facility]    CSN: [id number] Arrival date & time: 10/29/23  1240     History   Chief Complaint Chief Complaint  Patient presents with   Chest Pain   Shortness of Breath   Cough   Fever    HPI Malikah Principato is a 33 y.o. female.  3 day history of tactile fever and productive cough Bodyaches and sweats/chills but no temp measured This morning woke with chest pain and shortness of breath Lungs feel tight. Rates discomfort 7/10 Has used motrin, and cold & flu. Lat meds about 10 hours ago Several sick contacts, works with kids   LMP 1/30  History reviewed. No pertinent past medical history.  Patient Active Problem List   Diagnosis Date Noted   Active labor 01/11/2016   Labor and delivery, indication for care 01/11/2016   Rubella non-immune status, antepartum 01/11/2016    Past Surgical History:  Procedure Laterality Date   NO PAST SURGERIES      OB History     Gravida  1   Para  1   Term  1   Preterm      AB      Living  1      SAB      IAB      Ectopic      Multiple  0   Live Births  1            Home Medications    Prior to Admission medications   Medication Sig Start Date End Date Taking? Authorizing Provider  albuterol (VENTOLIN HFA) 108 (90 Base) MCG/ACT inhaler Inhale 2 puffs into the lungs every 6 (six) hours as needed for wheezing or shortness of breath. 10/29/23  Yes Craig Ionescu, Lurena Joiner, PA-C  benzonatate (TESSALON) 100 MG capsule Take 1 capsule (100 mg total) by mouth 3 (three) times daily as needed for cough. 10/29/23  Yes Zackry Deines, Lurena Joiner, PA-C  promethazine (PHENERGAN) 25 MG tablet Take 1 tablet (25 mg total) by mouth every 6 (six) hours as needed for nausea or vomiting. Patient not taking: Reported on 07/07/2015 06/13/15 07/07/15  Archie Patten, CNM    Family History Family History  Problem Relation Age of Onset   Breast cancer Mother 58   Cancer Mother    Diabetes Maternal Grandmother    Diabetes Maternal  Uncle    Breast cancer Cousin 68 - 14    Social History Social History   Tobacco Use   Smoking status: Never   Smokeless tobacco: Never  Vaping Use   Vaping status: Never Used  Substance Use Topics   Alcohol use: No   Drug use: No     Allergies   Promethazine, Aspirin, and Nsaids   Review of Systems Review of Systems Per HPI  Physical Exam Triage Vital Signs ED Triage Vitals  Encounter Vitals Group     BP 10/29/23 1245 (!) 121/93     Systolic BP Percentile --      Diastolic BP Percentile --      Pulse Rate 10/29/23 1245 89     Resp 10/29/23 1245 16     Temp 10/29/23 1245 98.7 F (37.1 C)     Temp Source 10/29/23 1245 Oral     SpO2 10/29/23 1245 98 %     Weight --      Height --      Head Circumference --      Peak Flow --      Pain Score  10/29/23 1247 7     Pain Loc --      Pain Education --      Exclude from Growth Chart --    No data found.  Updated Vital Signs BP 124/85 (BP Location: Left Arm)   Pulse 96   Temp (!) 100.4 F (38 C) (Oral)   Resp 18   SpO2 98%    Physical Exam Vitals and nursing note reviewed.  Constitutional:      General: She is not in acute distress.    Appearance: She is not ill-appearing.  HENT:     Right Ear: Tympanic membrane and ear canal normal.     Left Ear: Tympanic membrane and ear canal normal.     Nose: Rhinorrhea present.     Mouth/Throat:     Mouth: Mucous membranes are moist.     Pharynx: Oropharynx is clear. No posterior oropharyngeal erythema.  Eyes:     Conjunctiva/sclera: Conjunctivae normal.  Cardiovascular:     Rate and Rhythm: Normal rate and regular rhythm.     Pulses: Normal pulses.     Heart sounds: Normal heart sounds.  Pulmonary:     Effort: Pulmonary effort is normal. No respiratory distress.     Breath sounds: Normal breath sounds and air entry. No wheezing or rales.  Chest:     Chest wall: No tenderness.  Abdominal:     Tenderness: There is abdominal tenderness in the epigastric area.  There is no guarding or rebound.       Comments: Patient chest pain is found to be epigastric abdominal pain  Musculoskeletal:     Cervical back: Normal range of motion. No rigidity.  Lymphadenopathy:     Cervical: No cervical adenopathy.  Skin:    General: Skin is warm and dry.  Neurological:     Mental Status: She is alert and oriented to person, place, and time.     UC Treatments / Results  Labs (all labs ordered are listed, but only abnormal results are displayed) Labs Reviewed - No data to display  EKG  Radiology DG Chest 2 View Result Date: 10/29/2023 CLINICAL DATA:  Shortness of breath since this morning. Cough for 3 days with fever. EXAM: CHEST - 2 VIEW COMPARISON:  None Available. FINDINGS: The cardiomediastinal contours are normal. The lungs are clear. Pulmonary vasculature is normal. No consolidation, pleural effusion, or pneumothorax. No acute osseous abnormalities are seen. IMPRESSION: No active cardiopulmonary disease. Electronically Signed   By: Narda Rutherford M.D.   On: 10/29/2023 16:54    Procedures Procedures   Medications Ordered in UC Medications  acetaminophen (TYLENOL) tablet 650 mg (650 mg Oral Given 10/29/23 1516)    Initial Impression / Assessment and Plan / UC Course  I have reviewed the triage vital signs and the nursing notes.  Pertinent labs & imaging results that were available during my care of the patient were reviewed by me and considered in my medical decision making (see chart for details).  Temp 101, tylenol dose given and fever improved to 100.4 Sating 98% room air Clear lungs  Chest pain is actually found to be epigastric. No nausea or vomiting She does report tight feeling in the lungs like she can't take a deep breath. Although good lung sounds I have offered CXR. Imaging negative Offered albuterol inhaler, try tessalon for cough. Symptomatic and supportive care for likely viral illness. Suspect flu.  There are no influenza swabs  available in clinic at this time. She would  be out of the window for tamiflu as well. Discussed monitoring symptoms, ED and return precautions   Final Clinical Impressions(s) / UC Diagnoses   Final diagnoses:  Shortness of breath  Viral illness     Discharge Instructions      Your x-ray does not show any abnormality.  I am still waiting for the radiologist to confirm.  I will call you if they note anything abnormal.  In the meantime, you likely have a respiratory virus such as the flu.  Please continue symptomatic care at home!  Try the albuterol inhaler 2-3 times daily for tightness The tessalon cough pills can be taken 3x daily. If this medication makes you drowsy, take only one pill before bed. Drink lots of fluids!!  If symptoms worsen or become severe, please go to the emergency department.      ED Prescriptions     Medication Sig Dispense Auth. Provider   albuterol (VENTOLIN HFA) 108 (90 Base) MCG/ACT inhaler Inhale 2 puffs into the lungs every 6 (six) hours as needed for wheezing or shortness of breath. 8 g Leilan Bochenek, PA-C   benzonatate (TESSALON) 100 MG capsule Take 1 capsule (100 mg total) by mouth 3 (three) times daily as needed for cough. 30 capsule Elantra Caprara, Lurena Joiner, PA-C      PDMP not reviewed this encounter.   Jodeci Rini, Lurena Joiner, [place] 10/29/23 1708

## 2023-10-29 NOTE — ED Triage Notes (Signed)
Patient reports that she began having a non productive cough and fever 3 days ago.  Patient states she has been taking Children's Cold and Flu and Children's Motrin.   Patient also reports that she was wakened by chest pain and SOB at 0530 today and the pain goes from her chest to back and also has SOB.

## 2023-10-29 NOTE — Discharge Instructions (Addendum)
Your x-ray does not show any abnormality.  I am still waiting for the radiologist to confirm.  I will call you if they note anything abnormal.  In the meantime, you likely have a respiratory virus such as the flu.  Please continue symptomatic care at home!  Try the albuterol inhaler 2-3 times daily for tightness The tessalon cough pills can be taken 3x daily. If this medication makes you drowsy, take only one pill before bed. Drink lots of fluids!!  If symptoms worsen or become severe, please go to the emergency department.

## 2023-10-29 NOTE — ED Notes (Signed)
Dr. Marlinda Mike notified of patient's complaint and vitals signs. Patient back to the lobby.
